# Patient Record
Sex: Female | Born: 1955 | Race: White | Hispanic: No
Health system: Midwestern US, Community
[De-identification: ages and names within clinical notes are randomized; demographics above are authoritative.]

## PROBLEM LIST (undated history)

## (undated) DIAGNOSIS — I499 Cardiac arrhythmia, unspecified: Secondary | ICD-10-CM

## (undated) DIAGNOSIS — R9439 Abnormal result of other cardiovascular function study: Secondary | ICD-10-CM

---

## 2019-05-11 ENCOUNTER — Inpatient Hospital Stay: Admit: 2019-05-11

## 2019-05-13 LAB — NOVEL CORONAVIRUS (COVID-19): SARS-CoV-2, NAA: NOT DETECTED

## 2019-05-13 LAB — COVID-19: SARS-CoV-2, NAA: NOT DETECTED

## 2019-07-12 ENCOUNTER — Inpatient Hospital Stay: Admit: 2019-07-12

## 2019-07-13 LAB — NOVEL CORONAVIRUS (COVID-19): SARS-CoV-2, NAA: NOT DETECTED

## 2019-07-13 LAB — COVID-19: SARS-CoV-2, NAA: NOT DETECTED

## 2019-08-17 ENCOUNTER — Inpatient Hospital Stay: Admit: 2019-08-17

## 2019-08-19 LAB — NOVEL CORONAVIRUS (COVID-19): SARS-CoV-2, NAA: NOT DETECTED

## 2019-08-19 LAB — COVID-19: SARS-CoV-2, NAA: NOT DETECTED

## 2019-08-28 ENCOUNTER — Ambulatory Visit: Attending: Specialist

## 2019-08-28 ENCOUNTER — Ambulatory Visit: Admit: 2019-08-28 | Discharge: 2019-08-28 | Payer: MEDICAID | Attending: Specialist

## 2019-08-28 DIAGNOSIS — Z136 Encounter for screening for cardiovascular disorders: Secondary | ICD-10-CM
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
## 2019-09-01 ENCOUNTER — Ambulatory Visit

## 2019-09-01 ENCOUNTER — Ambulatory Visit: Admit: 2019-09-01 | Discharge: 2019-09-01 | Payer: MEDICAID

## 2019-09-01 DIAGNOSIS — I499 Cardiac arrhythmia, unspecified: Secondary | ICD-10-CM

## 2019-09-04 LAB — ECHO ADULT COMPLETE
AV Area by Peak Velocity: 1.3 cm2
AV Area by VTI: 1.3 cm2
AV Cusp Mmode: 1.27 cm
AV Mean Gradient: 5 mmHg
AV Mean Velocity: 1.0535 m/s
AV Peak Gradient: 9.5 mmHg
AV Peak Velocity: 154.17 cm/s
AV VTI Ratio: 0.6
AV VTI: 32.57 cm
AV Velocity Ratio: 0.65
AVA/BSA Peak Velocity: 0.8 cm2/m2
AVA/BSA VTI: 0.8 cm2/m2
Ascending Aorta: 2.57 cm
EF BP: 63.3 % (ref 55.0–100.0)
Est. RA Pressure: 8 mmHg
IVSd M-mode: 0.6 cm
IVSs M-mode: 0.93 cm
LA Area 2C: 14.4 cm2
LA Area 4C: 15.4 cm2
LA Volume 2C: 34.81 mL (ref 22.00–52.00)
LA Volume 4C: 41.86 mL (ref 22.00–52.00)
LA Volume Index 2C: 22.63 ml/m2 (ref 16.00–28.00)
LA Volume Index 4C: 27.21 ml/m2 (ref 16.00–28.00)
LV EDV A2C: 65.9 mL
LV EDV A4C: 67.5 mL
LV EDV BP: 68.3 mL (ref 56.0–104.0)
LV EDV Index A2C: 42.8 mL/m2
LV EDV Index A4C: 43.9 mL/m2
LV EDV Index BP: 44.4 mL/m2
LV ESV A2C: 21.9 mL
LV ESV A4C: 23.8 mL
LV ESV BP: 25 mL (ref 19.0–49.0)
LV ESV Index A2C: 14.2 mL/m2
LV ESV Index A4C: 15.5 mL/m2
LV ESV Index BP: 16.2 mL/m2
LV Ejection Fraction A2C: 67 %
LV Ejection Fraction A4C: 65 %
LVIDd M-mode: 4.17 cm
LVIDs M-mode: 2.6 cm
LVOT Diameter: 1.61 cm
LVOT Peak Gradient: 4 mmHg
LVOT Peak Velocity: 99.9 cm/s
LVOT VTI: 21.02 cm
LVPWd M-mode: 0.9 cm
LVPWs M-mode: 0.9 cm
Left Ventricular Fractional Shortening: 37.6 %
Left Ventricular Outflow Tract Mean Gradient: 2.1881 mmHg
Left Ventricular Outflow Tract Mean Velocity: 0.6699 cm/s
Left Ventricular Stroke Volume by 2-D Biplane-MOD: 28.0189 mL
Left Ventricular Stroke Volume by 2-D Single Plane- MOD: 28.2801 mL
Left Ventricular Stroke Volume by 2-D Single Plane- MOD: 28.4924 mL
MV A Velocity: 82.06 cm/s
MV E Velocity: 77.47 cm/s
MV E Wave Deceleration Time: 193.6 ms
MV E/A: 0.94
Mitral Valve Deceleration Slope: 4.0015
PASP: 42.9 mmHg
PV AT: 166.1 ms
PV Max Velocity: 91.14 cm/s
PV Peak Gradient: 3.3 mmHg
RA Volume: 18.3 ml
RVSP: 42.9 mmHg
TR Max Velocity: 295.54 cm/s
TR Peak Gradient: 34.9 mmHg

## 2019-09-04 LAB — TRANSTHORACIC ECHOCARDIOGRAM (TTE) COMPLETE (CONTRAST/BUBBLE/3D PRN)
AV Area by Peak Velocity: 1.3 cm2
AV Area by VTI: 1.3 cm2
AV Cusp Mmode: 1.27 cm
AV Mean Gradient: 5 mmHg
AV Mean Velocity: 1.0535 m/s
AV Peak Gradient: 9.5 mmHg
AV Peak Velocity: 154.17 cm/s
AV VTI: 32.57 cm
AV Velocity Ratio: 0.65
AVA/BSA Peak Velocity: 0.8 cm2/m2
AVA/BSA VTI: 0.8 cm2/m2
Ascending Aorta: 2.57 cm
EF BP: 63.3 % (ref 55–100)
Est. RA Pressure: 8 mmHg
IVSd M-mode: 0.6 cm
IVSs M-mode: 0.93 cm
LA Area 2C: 14.4 cm2
LA Area 4C: 15.4 cm2
LA Volume 2C: 34.81 mL (ref 22–52)
LA Volume 4C: 41.86 mL (ref 22–52)
LA Volume Index 2C: 22.63 ml/m2 (ref 16–28)
LA Volume Index 4C: 27.21 ml/m2 (ref 16–28)
LV EDV A2C: 65.9 mL
LV EDV A4C: 67.5 mL
LV EDV BP: 68.3 mL (ref 56–104)
LV EDV Index A2C: 42.8 mL/m2
LV EDV Index A4C: 43.9 mL/m2
LV EDV Index BP: 44.4 mL/m2
LV ESV A2C: 21.9 mL
LV ESV A2C: 28.4924 mL
LV ESV A4C: 23.8 mL
LV ESV A4C: 28.2801 mL
LV ESV BP: 25 mL (ref 19–49)
LV ESV Index A2C: 14.2 mL/m2
LV ESV Index A4C: 15.5 mL/m2
LV ESV Index BP: 16.2 mL/m2
LV Ejection Fraction A2C: 67 %
LV Ejection Fraction A4C: 65 %
LVIDd M-mode: 4.17 cm
LVIDs M-mode: 2.6 cm
LVOT Diameter: 1.61 cm
LVOT Mean Gradient: 2.1881 mmHg
LVOT Peak Gradient: 4 mmHg
LVOT Peak Velocity: 99.9 cm/s
LVOT VTI: 21.02 cm
LVOT:AV VTI Index: 0.6
LVPWd M-mode: 0.9 cm
LVPWs M-mode: 0.9 cm
Left Ventricular Ejection Fraction: 63
Left Ventricular Fractional Shortening: 37.6 %
Left Ventricular Outflow Tract Mean Velocity: 0.6699 cm/s
Left Ventricular Stroke Volume by 2-D Biplane-MOD: 28.0189 mL
MV A Velocity: 82.06 cm/s
MV E Velocity: 77.47 cm/s
MV E Wave Deceleration Time: 193.6 ms
MV E/A: 0.94
Mitral Valve E-F Slope by M-mode: 4.0015
PASP: 42.9 mmHg
PV AT: 166.1 ms
PV Max Velocity: 91.14 cm/s
PV Peak Gradient: 3.3 mmHg
RA Volume: 18.3 ml
RVSP: 42.9 mmHg
TR Max Velocity: 295.54 cm/s
TR Peak Gradient: 34.9 mmHg

## 2019-09-12 ENCOUNTER — Inpatient Hospital Stay: Admit: 2019-09-12

## 2019-09-13 LAB — NOVEL CORONAVIRUS (COVID-19): SARS-CoV-2, NAA: NOT DETECTED

## 2019-09-13 LAB — COVID-19: SARS-CoV-2, NAA: NOT DETECTED

## 2019-10-02 ENCOUNTER — Encounter

## 2019-10-02 ENCOUNTER — Inpatient Hospital Stay: Admit: 2019-10-02

## 2019-10-02 ENCOUNTER — Institutional Professional Consult (permissible substitution): Admit: 2019-10-02 | Discharge: 2019-10-02 | Payer: MEDICAID | Attending: Specialist

## 2019-10-02 DIAGNOSIS — R9439 Abnormal result of other cardiovascular function study: Secondary | ICD-10-CM

## 2019-10-02 LAB — EXERCISE CARDIAC STRESS TEST
Angina Index: 0
Baseline Diastolic BP: 60 mmHg
Baseline HR: 82 {beats}/min
Baseline Systolic BP: 98 mmHg
Duke Treadmill Score: -4
Exercise Duration Time: 6:0 {titer}
Stress Diastolic BP: 60 mmHg
Stress Estimated Workload: 7 METS
Stress Peak HR: 157 {beats}/min
Stress Percent HR Achieved: 101 %
Stress Rate Pressure Product: 22922 bpm*mmHg
Stress ST Depression: 2 mm
Stress Systolic BP: 146 mmHg
Stress Target HR: 156 {beats}/min

## 2019-10-02 LAB — STRESS TEST ONLY EXERCISE
Angina Index: 0
Baseline Diastolic BP: 60 mmHg
Baseline HR: 82 {beats}/min
Baseline Systolic BP: 98 mmHg
Duke Treadmill Score: -4
Exercise Duration Time: 6:0 {titer}
Stress Diastolic BP: 60 mmHg
Stress Estimated Workload: 7 METS
Stress Peak HR: 157 {beats}/min
Stress Percent HR Achieved: 101 %
Stress Rate Pressure Product: 22922 bpm*mmHg
Stress ST Depression: 2 mm
Stress Systolic BP: 146 mmHg
Stress Target HR: 156 {beats}/min

## 2019-10-02 NOTE — Progress Notes (Signed)
Dear Dr. Odis Hollingshead, Deno Lunger, MD     Ms. Grill underwent treadmill stress test today.    Stress Test Report  Exercise time: 6 minutes 0 seconds.    Heart Rate: 100 % of maximum predicted Heart Rate    Blood Pressure Response: Normal    Symptoms: Fatigue    EKG Changes: abnormal; 2 mm horizontal ST depression infero-lateral    Recommendations:  1. Continue Cardiac medication  2. Diet and exercise counseling  3. Recommend nuclear stress test   Stress test results were discussed with the patient.      Echo report D/w patient  ?? LV: Estimated LVEF is 60 - 65%. Normal cavity size, wall thickness, systolic function (ejection fraction normal) and diastolic function.  ?? PA: Pulmonary arterial systolic pressure is 37 mmHg.  ?? Trace TR noted.    Rec- Nuclear stress; Patient agrees    Thank you for allowing me to participate in the care of Brooke Lawson.    Sincerely,  Dr. Lyndel Pleasure

## 2019-10-02 NOTE — Progress Notes (Signed)
Dear Dr. Profeta, George J, MD     [name] underwent treadmill stress test today.    Stress Test Report  Exercise time: 6 minutes 0 seconds.    Heart Rate: 100 % of maximum predicted Heart Rate    Blood Pressure Response: Normal    Symptoms: Fatigue    EKG Changes: abnormal; 2 mm horizontal ST depression infero-lateral    Recommendations:  1. Continue Cardiac medication  2. Diet and exercise counseling  3. Recommend nuclear stress test   Stress test results were discussed with the patient.      Echo report D/w patient  ?? LV: Estimated LVEF is 60 - 65%. Normal cavity size, wall thickness, systolic function (ejection fraction normal) and diastolic function.  ?? PA: Pulmonary arterial systolic pressure is 37 mmHg.  ?? Trace TR noted.    Rec- Nuclear stress; Patient agrees    Thank you for allowing me to participate in the care of Brooke Lawson.    Sincerely,  Dr. Brookelynne Dimperio

## 2019-10-03 LAB — NOVEL CORONAVIRUS (COVID-19): SARS-CoV-2, NAA: NOT DETECTED

## 2019-10-03 LAB — COVID-19: SARS-CoV-2, NAA: NOT DETECTED

## 2019-10-12 ENCOUNTER — Encounter

## 2019-11-28 ENCOUNTER — Ambulatory Visit

## 2019-11-28 ENCOUNTER — Ambulatory Visit: Admit: 2019-11-28 | Discharge: 2019-11-28 | Payer: MEDICAID

## 2019-11-28 DIAGNOSIS — R9439 Abnormal result of other cardiovascular function study: Secondary | ICD-10-CM

## 2019-11-28 MED ORDER — TECHNETIUM TC-99M SESTAMIBI (CARDIOLITE) INJECTION WITH DILUTION KIT
Freq: Once | INTRAVENOUS | Status: AC
Start: 2019-11-28 — End: 2019-11-28
  Administered 2019-11-28: 14:00:00 via INTRAVENOUS

## 2019-11-28 MED ORDER — TECHNETIUM TC-99M SESTAMIBI (CARDIOLITE) INJECTION WITH DILUTION KIT
Freq: Once | INTRAVENOUS | Status: AC
Start: 2019-11-28 — End: 2019-11-28
  Administered 2019-11-28: 17:00:00 via INTRAVENOUS

## 2019-12-07 LAB — NM STRESS TEST WITH MYOCARDIAL PERFUSION
Angina Index: 0
Baseline Diastolic BP: 60 mmHg
Baseline HR: 58 {beats}/min
Baseline Systolic BP: 100 mmHg
Left Ventricular Ejection Fraction: 74
Stress Diastolic BP: 60 mmHg
Stress Estimated Workload: 9.3 METS
Stress Peak HR: 162 {beats}/min
Stress Percent HR Achieved: 104 %
Stress Rate Pressure Product: 22032 bpm*mmHg
Stress Systolic BP: 136 mmHg
Stress Target HR: 156 {beats}/min

## 2019-12-08 LAB — NUCLEAR CARDIAC STRESS TEST
Angina Index: 0
Baseline Diastolic BP: 60 mmHg
Baseline HR: 58 {beats}/min
Baseline Systolic BP: 100 mmHg
Stress Diastolic BP: 60 mmHg
Stress Estimated Workload: 9.3 METS
Stress Peak HR: 162 {beats}/min
Stress Percent HR Achieved: 104 %
Stress Rate Pressure Product: 22032 bpm*mmHg
Stress Systolic BP: 136 mmHg
Stress Target HR: 156 {beats}/min

## 2019-12-11 ENCOUNTER — Ambulatory Visit: Attending: Specialist

## 2019-12-11 ENCOUNTER — Ambulatory Visit: Admit: 2019-12-11 | Discharge: 2019-12-11 | Payer: MEDICAID | Attending: Specialist

## 2019-12-11 DIAGNOSIS — I499 Cardiac arrhythmia, unspecified: Secondary | ICD-10-CM
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
## 2019-12-11 NOTE — Addendum Note (Signed)
Addendum Note by Lyndel Pleasure, MD at 12/11/19 1130                Author: Lyndel Pleasure, MD  Service: --  Author Type: Physician       Filed: 12/11/19 1235  Encounter Date: 12/11/2019  Status: Signed          Editor: Lyndel Pleasure, MD (Physician)          Addended by: Lyndel Pleasure on: 12/11/2019 12:35 PM    Modules accepted: Orders

## 2019-12-20 ENCOUNTER — Ambulatory Visit

## 2019-12-20 ENCOUNTER — Ambulatory Visit: Admit: 2019-12-20 | Discharge: 2019-12-20 | Payer: MEDICAID

## 2019-12-20 DIAGNOSIS — I499 Cardiac arrhythmia, unspecified: Secondary | ICD-10-CM

## 2019-12-20 LAB — DUPLEX CAROTID BILATERAL
LEFT COMMON CAROTID ARTERY MID D: 21.1 cm/s
LEFT COMMON CAROTID ARTERY MID S: 95.6 cm/s
LEFT EXTERNAL CAROTID ARTERY D: 8.4 cm/s
LEFT VERTEBRAL ARTERY D: 10.9 cm/s
Left CCA dist dias: 14.1 cm/s
Left CCA dist sys: 63.3 cm/s
Left CCA prox dias: 23.9 cm/s
Left CCA prox sys: 122.3 cm/s
Left ECA sys: 119.5 cm/s
Left ICA dist dias: 23.9 cm/s
Left ICA dist sys: 61.9 cm/s
Left ICA mid dias: 18.3 cm/s
Left ICA mid sys: 56.2 cm/s
Left ICA prox dias: 18.3 cm/s
Left ICA prox sys: 70.3 cm/s
Left vertebral sys: 39.4 cm/s
RIGHT COMMON CAROTID ARTERY MID D: 16.9 cm/s
RIGHT COMMON CAROTID ARTERY MID S: 71.7 cm/s
RIGHT EXTERNAL CAROTID ARTERY D: 8.4 cm/s
RIGHT VERTEBRAL ARTERY D: 8.4 cm/s
Right CCA dist dias: 18.3 cm/s
Right CCA prox dias: 19.7 cm/s
Right CCA prox sys: 115.3 cm/s
Right ICA dist dias: 25.3 cm/s
Right ICA dist sys: 68.9 cm/s
Right ICA mid dias: 28.1 cm/s
Right ICA mid sys: 73.1 cm/s
Right ICA prox dias: 26.7 cm/s
Right ICA prox sys: 94.2 cm/s
Right cca dist sys: 70.3 cm/s
Right eca sys: 104 cm/s
Right vertebral sys: 42.2 cm/s

## 2019-12-20 LAB — VAS DUP CAROTID BILATERAL
Left CCA dist EDV: 14.1 cm/s
Left CCA dist PSV: 63.3 cm/s
Left CCA mid EDV: 21.1 cm/s
Left CCA mid PSV: 95.6 cm/s
Left CCA prox EDV: 23.9 cm/s
Left CCA prox PSV: 122.3 cm/s
Left ECA EDV: 8.4 cm/s
Left ECA PSV: 119.5 cm/s
Left ICA dist EDV: 23.9 cm/s
Left ICA dist PSV: 61.9 cm/s
Left ICA mid EDV: 18.3 cm/s
Left ICA mid PSV: 56.2 cm/s
Left ICA prox EDV: 18.3 cm/s
Left ICA prox PSV: 70.3 cm/s
Left vertebral EDV: 10.9 cm/s
Left vertebral PSV: 39.4 cm/s
Right CCA dist EDV: 18.3 cm/s
Right CCA mid EDV: 16.9 cm/s
Right CCA mid PSV: 71.7 cm/s
Right CCA prox EDV: 19.7 cm/s
Right CCA prox PSV: 115.3 cm/s
Right ECA EDV: 8.4 cm/s
Right ECA PSV: 104 cm/s
Right ICA dist EDV: 25.3 cm/s
Right ICA dist PSV: 68.9 cm/s
Right ICA mid EDV: 28.1 cm/s
Right ICA mid PSV: 73.1 cm/s
Right ICA prox EDV: 26.7 cm/s
Right ICA prox PSV: 94.2 cm/s
Right cca dist PSV: 70.3 cm/s
Right vertebral EDV: 8.4 cm/s
Right vertebral PSV: 42.2 cm/s

## 2020-03-04 ENCOUNTER — Encounter: Payer: MEDICAID | Attending: Specialist

## 2020-03-07 ENCOUNTER — Ambulatory Visit: Attending: Specialist

## 2020-03-07 ENCOUNTER — Ambulatory Visit: Admit: 2020-03-07 | Discharge: 2020-03-07 | Payer: MEDICAID | Attending: Specialist

## 2020-03-07 DIAGNOSIS — I499 Cardiac arrhythmia, unspecified: Secondary | ICD-10-CM
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
## 2020-03-14 ENCOUNTER — Encounter: Payer: MEDICAID | Attending: Specialist

## 2020-11-22 IMAGING — MG MAMMOGRAPHY DIAGNOSTIC BILATERAL 3[PERSON_NAME]
8 series · 8 of 24 positions shown · non-contrast
Comparison: Ultrasound of 02/07/2020. Exams dating back to 06/13/2015.

FINAL Diagnostic Imaging Report 
________________________________________________________________________________________________ 
MAMMOGRAPHY DIAGNOSTIC BILATERAL 3MELIDA LIANG, BREAST ULTRASOUND BILATERAL COMPLETE, 
11/22/2020 [DATE]: 
CLINICAL INDICATION: Patient states right breast pain for over one year. Pain 
getting more intense and feels a pulling sensation at nipple area since June 2020. Follow-up outside left breast ultrasound exam.
TECHNIQUE: Mammogram: Digital bilateral mammograms and 3-D Tomosynthesis were 
obtained. These were interpreted both primarily and with the aid of 
computer-aided detection system. Ultrasound: Ultrasound was performed of all 4 
quadrants of both breasts, retroareolar region and axilla. 
BREAST DENSITY: (Level C) The breasts are heterogeneously dense, which may 
obscure small masses.

[L MLO]
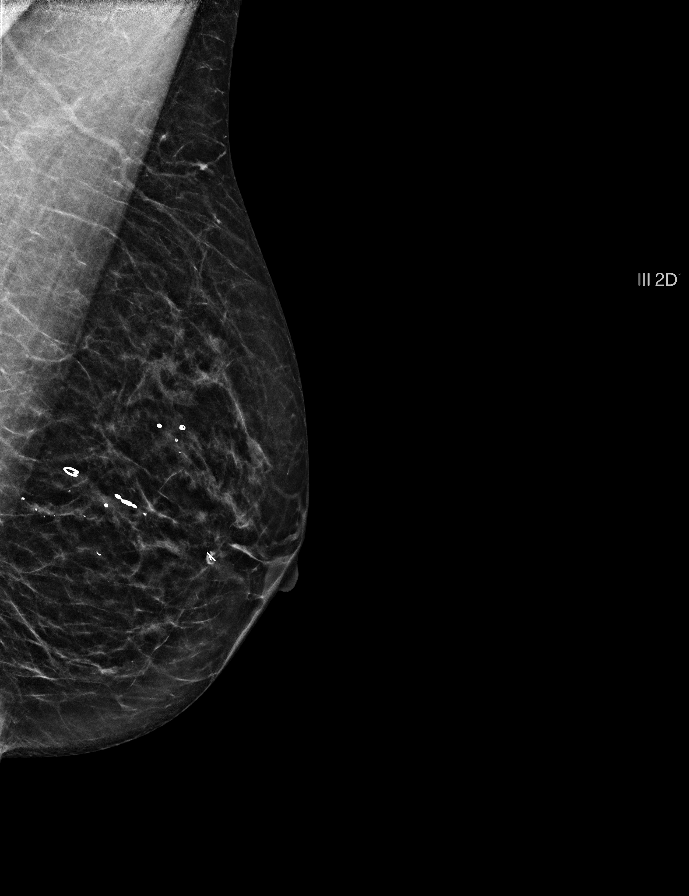

[R MLO]
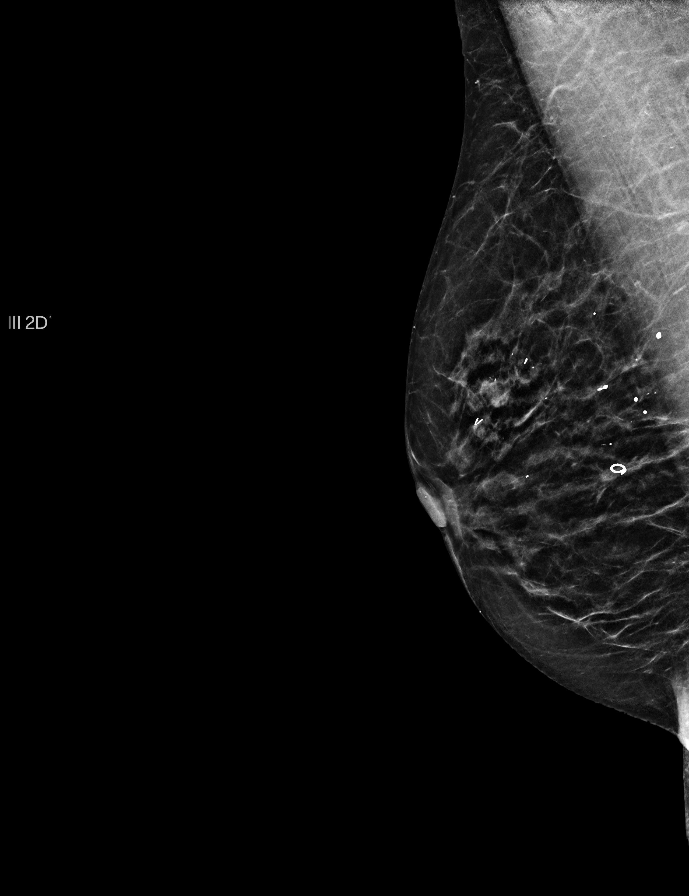

[L CC]
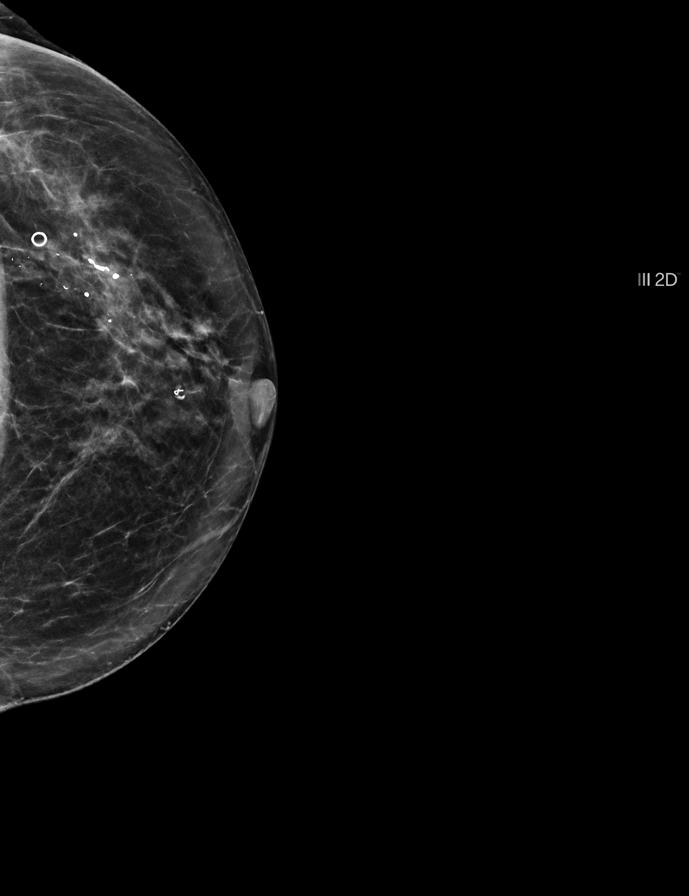

[R CC]
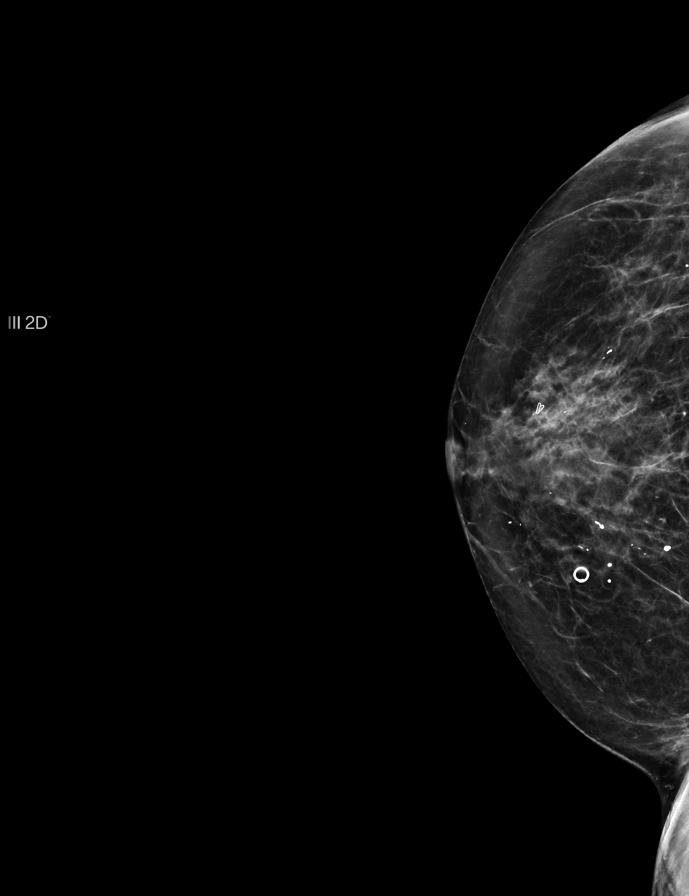

[L MLO tomo · tomo slice 21/41.0]
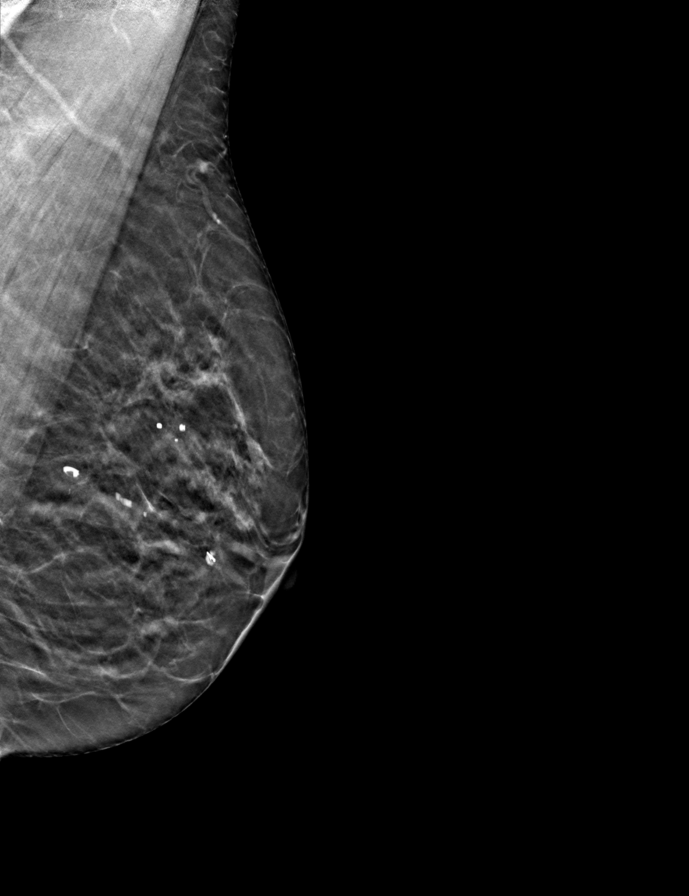

[R CC tomo · tomo slice 21/42.0]
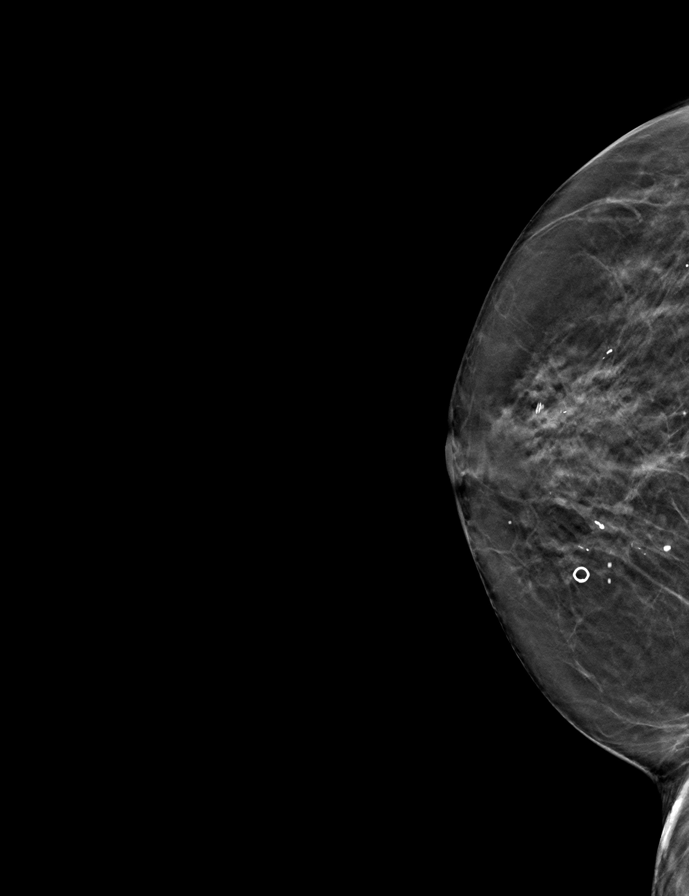

[L CC tomo · tomo slice 22/43.0]
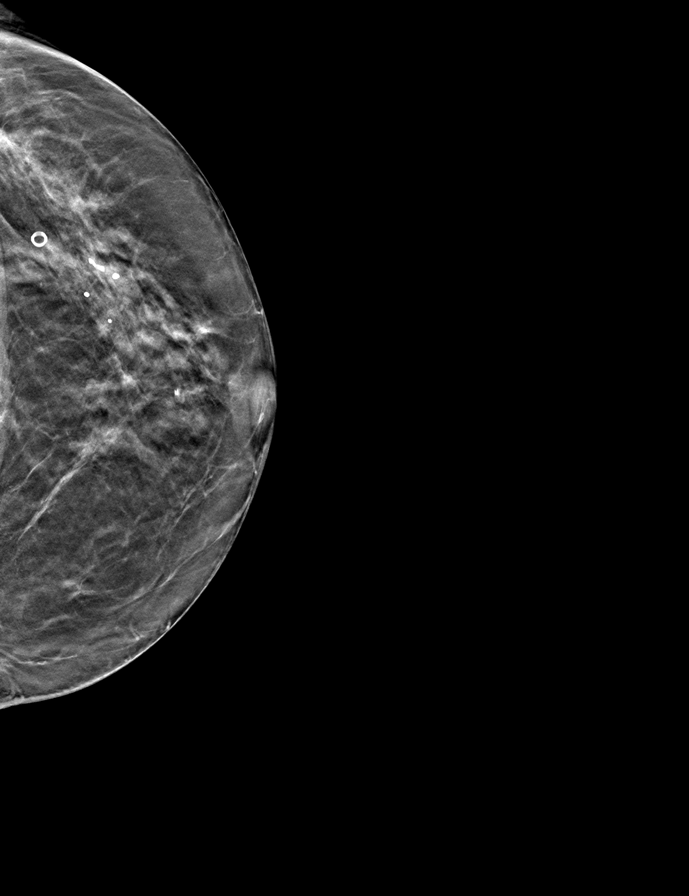

[R MLO tomo · tomo slice 21/40.0]
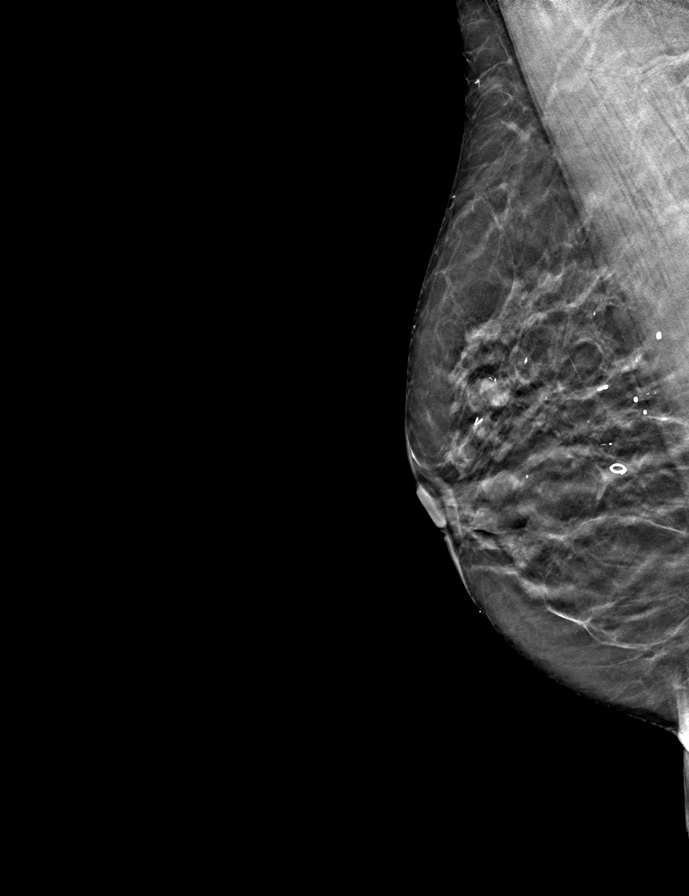

[8 of 24 positions shown; findings below may reference images not displayed]

FINDINGS: Mammogram: Post biopsy tissue markers both breasts. No suspicious mass, 
calcifications, or architectural distortion in either breast. Overall stable 
mammographic appearance. 
Ultrasound: Within the right breast at the [DATE] position, 1 cm from the level 
the nipple is a hypoechoic area with echogenic central hilum compatible with an 
intramammary node. This measures 6 x 3 x 5 mm and previously measured 5 x 4 x 3 
mm. There are adjacent subcentimeter intramammary nodes. Within the right 
retroareolar region there is a cyst containing some debris measuring 13 x 7 x 12 
mm. 
Within the left breast at the [DATE] position, 2 cm from the level the nipple is a 
hypoechoic area with central echogenic hilum compatible with an intramammary 
node. This measures 6 x 3 x 7 mm. Again seen is the clip in the [DATE] position, 5 
cm from the level of the nipple. At the [DATE] position, 5 cm from the level the 
nipple, adjacent to the clip, is a hypoechoic circumscribed area measuring 6 x 2 
x 5 mm, previously measuring 7 x 2 x 4 mm.
IMPRESSION: No ultrasonographic or mammographic findings suggestive for malignancy. 
( BI-RADS 2) Benign findings. Routine mammographic follow-up is recommended.

## 2020-12-19 IMAGING — MR MRI BREAST BILATERAL W/WO CONTRAST
5 of 12 series · 19 of 48 positions shown · IV contrast (gadolinium)
Comparison: Mammogram and ultrasound of 11/22/2020. Exams dating back to 
06/13/2015.

FINAL Diagnostic Imaging Report 
________________________________________________________________________________________________ 
MRI BREAST BILATERAL W/WO CONTRAST, 12/19/2020 [DATE]: 
CLINICAL INDICATION: Abnormal mammogram. Persistent right breast pain. History 
of benign biopsy years ago.
TECHNIQUE: Multiple sequences were obtained in various planes with both before 
and after the intravenous administration of gadolinium. In addition to the 
routine images, three-dimensional renderings were performed on an independent 
workstation, time activity curves generated over areas of enhancement and 
computer-aided detection utilized. 5.5 mL of Gadavist were injected 
intravenously with the injector at a rate of 1.5 cc per second. Patient was 
scanned on a 3T magnet.

[Series 201: survey · axial · 7.0mm · 1.34mm/px · z∈[-15,+244]mm · 2 of 25 slices shown]
[im 1/25]
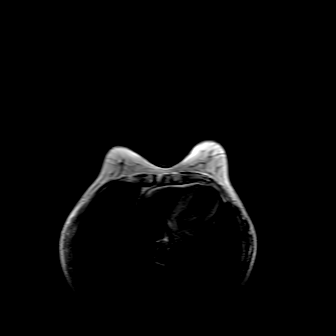
[im 25/25]
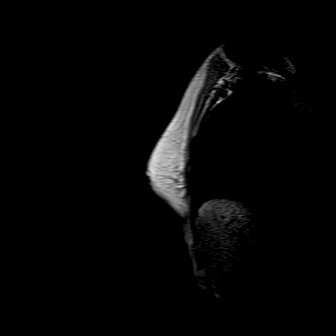

[Series 301: t1w_ffe_3d · axial · 1.5mm · 0.49mm/px · z∈[-52,+128]mm · 5 of 205 slices shown]
[im 1/205]
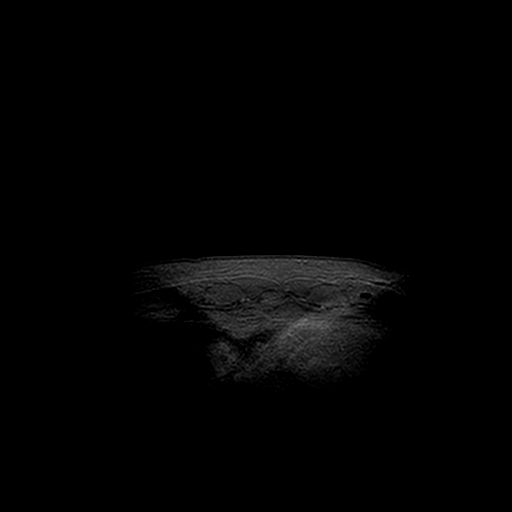
[im 52/205]
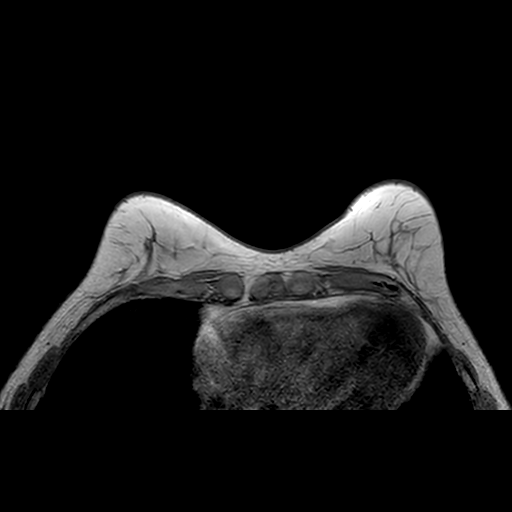
[im 103/205]
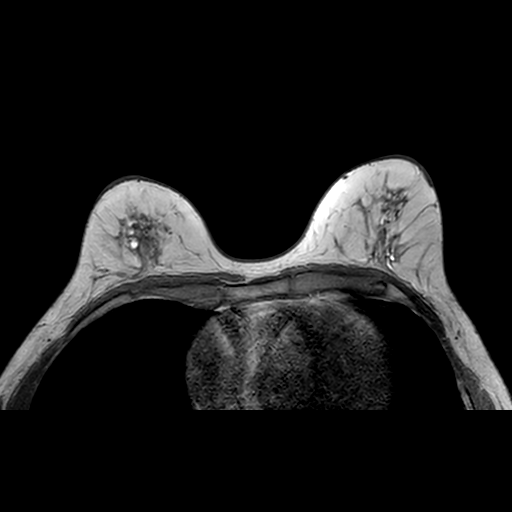
[im 154/205]
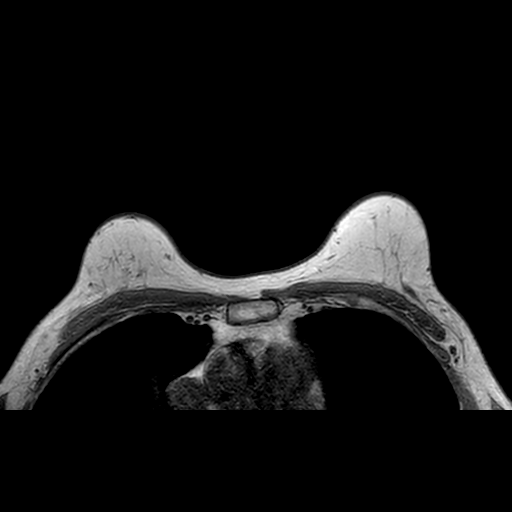
[im 205/205]
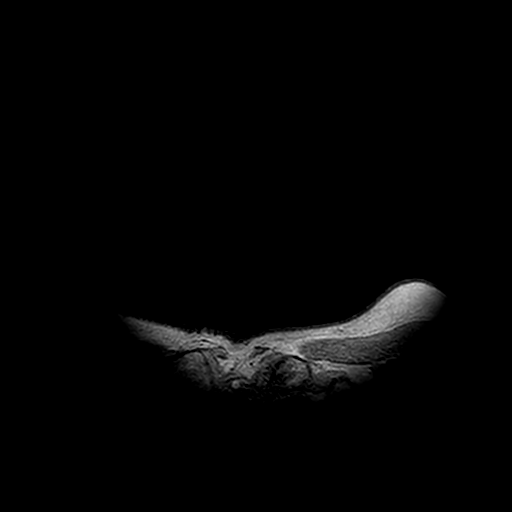

[Series 401: t2w_(id) · axial · 2.5mm · 0.58mm/px · z∈[-55,+122]mm · 2 of 71 slices shown]
[im 1/71]
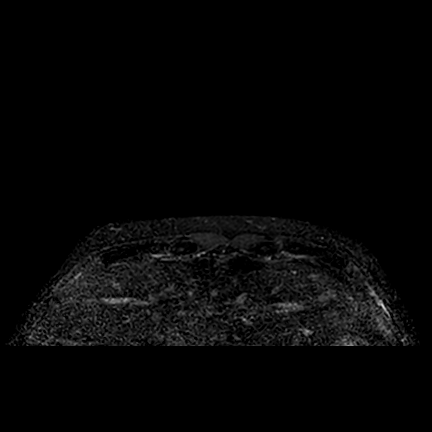
[im 71/71]
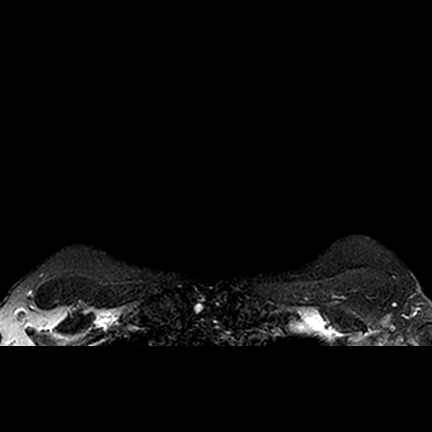

[Series 502: ssub dyn 1 · axial · 1.5mm · 0.45mm/px · z∈[-55,+124]mm · 5 of 240 slices shown]
[im 1/240]
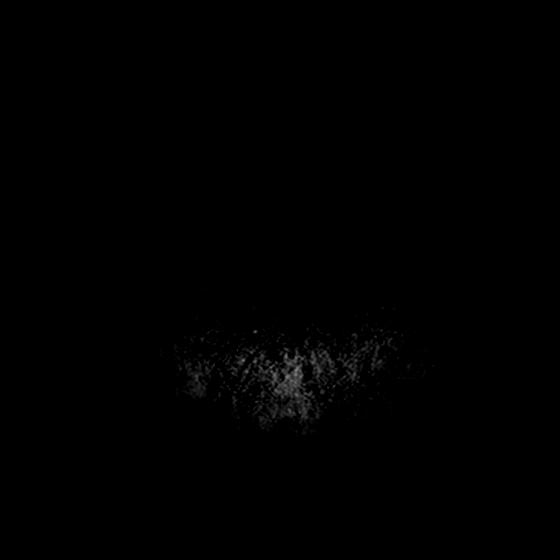
[im 60/240]
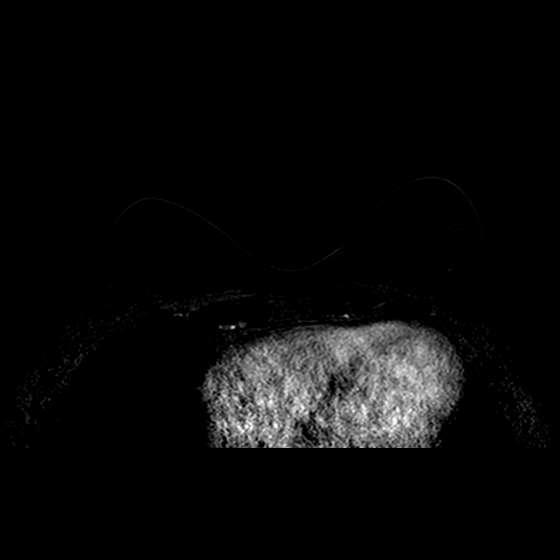
[im 120/240]
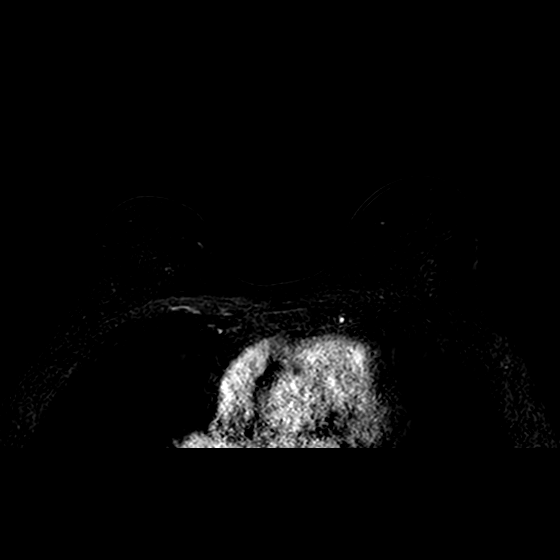
[im 180/240]
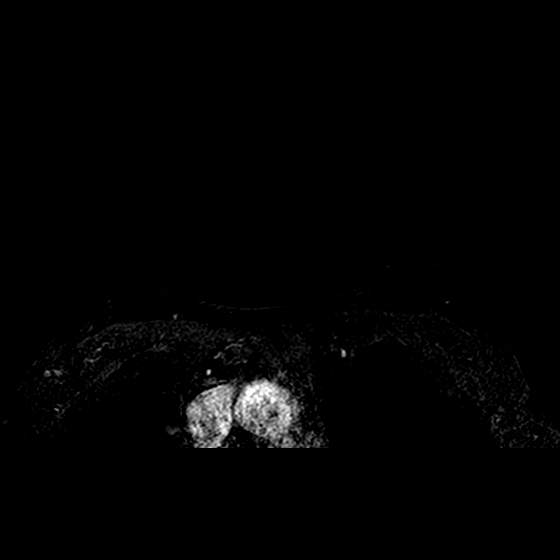
[im 240/240]
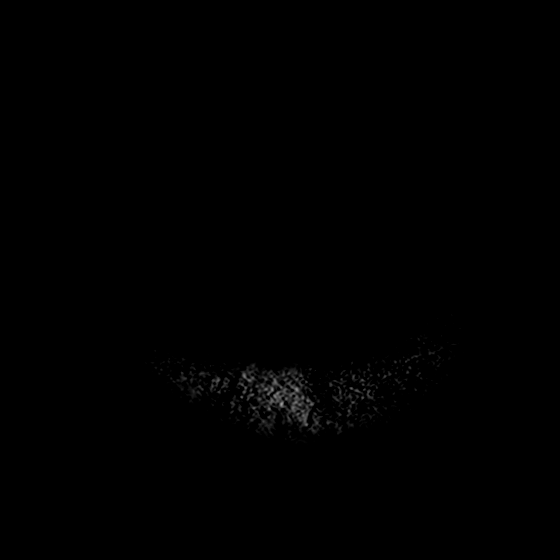

[Series 503: ssub dyn 2 · axial · 1.5mm · 0.45mm/px · z∈[-55,+124]mm · 5 of 240 slices shown]
[im 1/240]
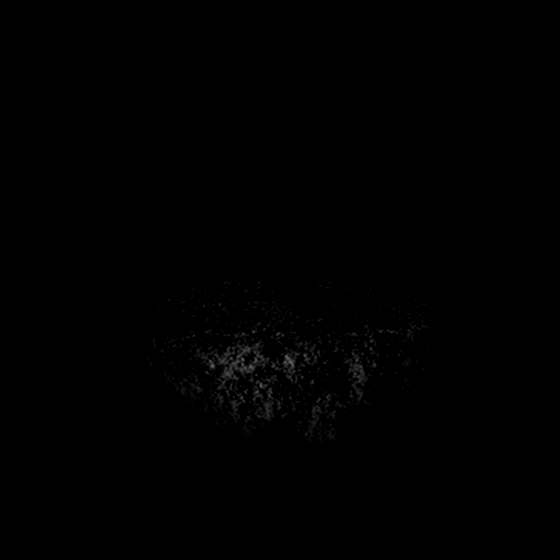
[im 60/240]
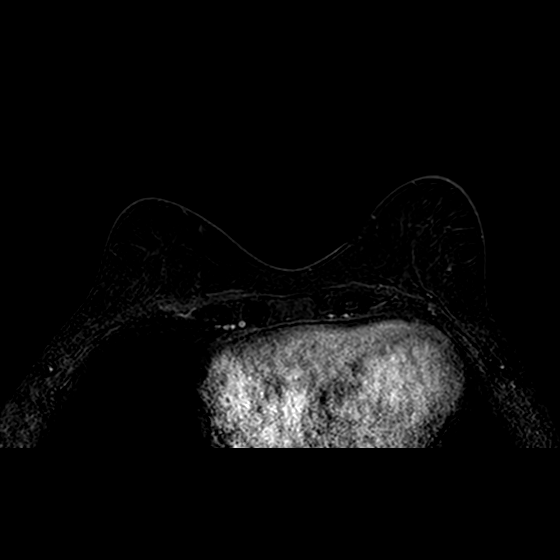
[im 120/240]
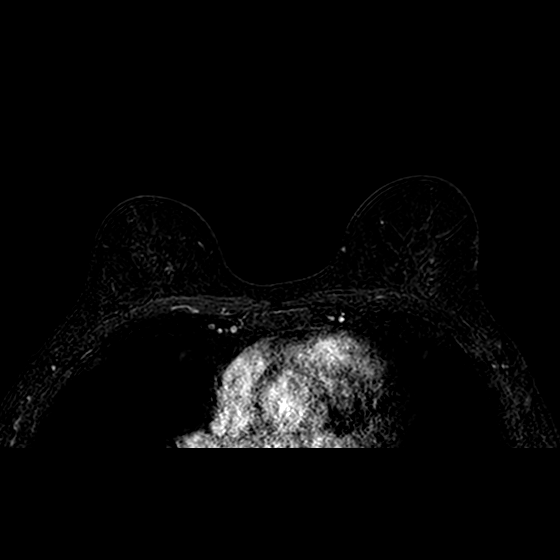
[im 180/240]
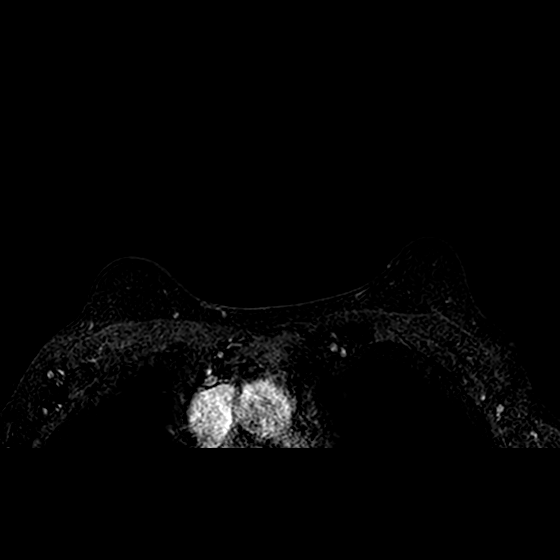
[im 240/240]
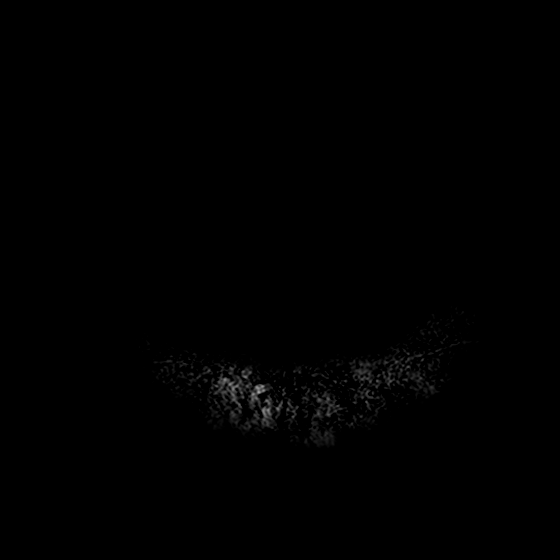

[19 of 48 positions shown; findings below may reference images not displayed]

FINDINGS: Heterogeneous breast parenchyma. Mild background parenchymal 
enhancement. 
Right breast: There is metallic susceptibility artifact about the postbiopsy 
tissue marker. No abnormal areas of enhancement. No areas of enhancement meeting 
threshold criteria on CAD analysis. Small nonspecific right axillary lymph 
nodes.  No internal mammary nodes. 
Left breast: There is metallic susceptibility artifact about the postbiopsy 
tissue marker. No abnormal areas of enhancement. No areas of enhancement meeting 
threshold criteria on CAD analysis. Small nonspecific left axillary lymph nodes. 
 No internal mammary nodes.
IMPRESSION: No MR findings suggestive for malignancy. 
(BI-RADS 2) Benign findings. Routine mammographic follow-up is recommended.

## 2021-01-20 IMAGING — CT CT ABDOMEN AND PELVIS WITH CONTRAST
2 of 3 series · 16 of 46 positions shown, 18 images · IV contrast (isovue)
Comparison: 

________________________________________________________________________________________________ 
CT ABDOMEN AND PELVIS WITH CONTRAST, 01/20/2021 [DATE]: 
A search for DICOM formatted images was conducted for prior CT imaging studies 
completed at a non-affiliated media free facility.   
CLINICAL INDICATION: Stabbing pain in the abdomen radiating to right lower 
quadrant.
TECHNIQUE: The abdomen and pelvis was scanned from lung bases through the pubic 
rami with 100 mL of Isovue 300 on a high-resolution CT scanner using dose 
reduction techniques.  Routine MPR reconstructions were performed.

[Series 4: axial · axial · 0.60mm/px · z∈[+939,+1299]mm · 13 of 138 slices shown, 15 images]
[im 9/138  soft-tissue]
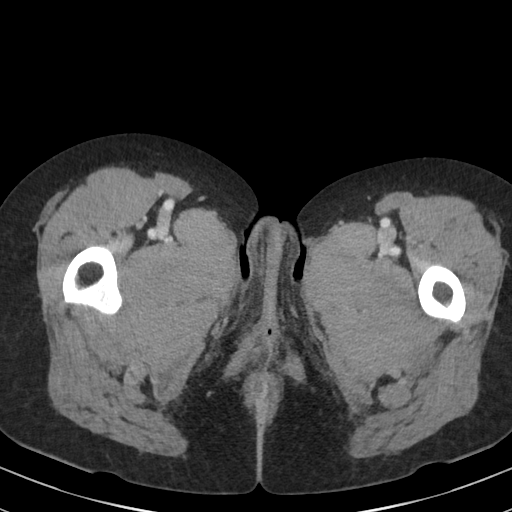
[im 9/138  bone]
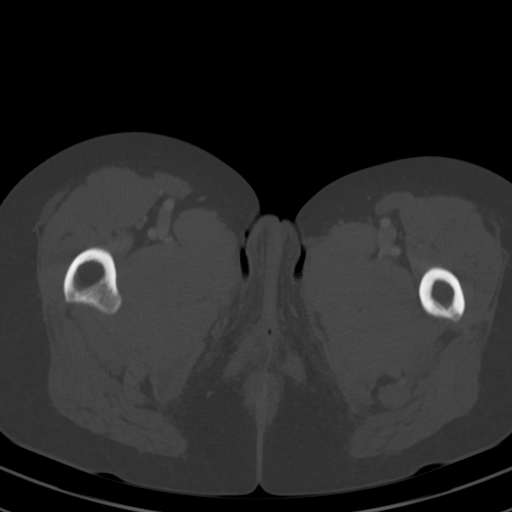
[im 18/138  soft-tissue]
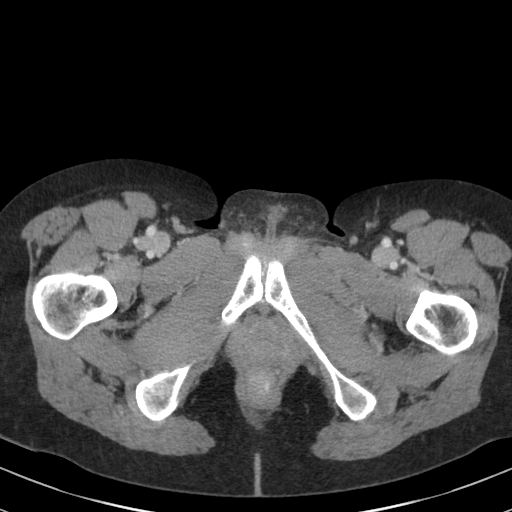
[im 27/138  soft-tissue]
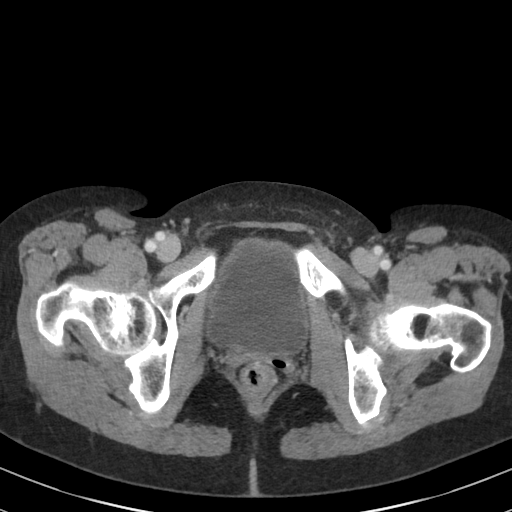
[im 40/138  soft-tissue]
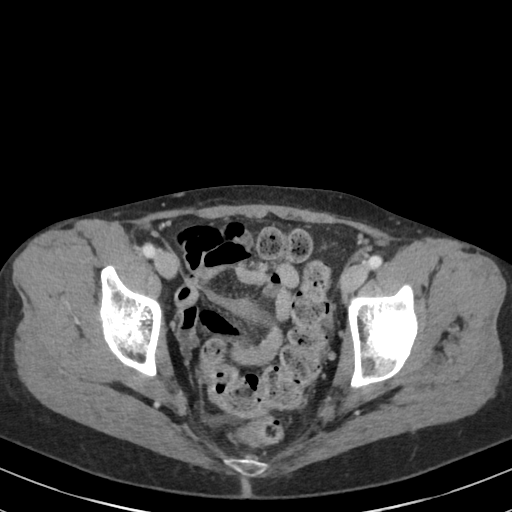
[im 49/138  soft-tissue]
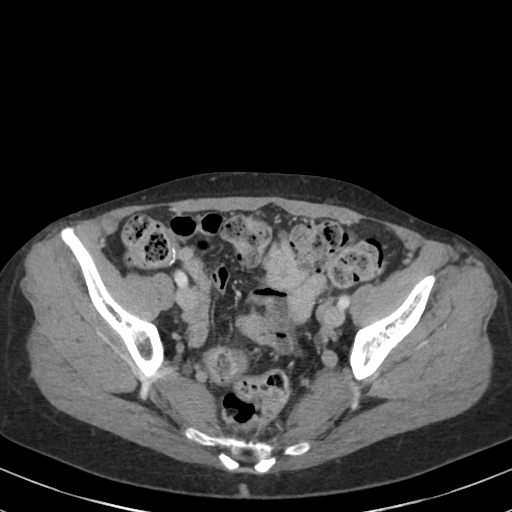
[im 58/138  soft-tissue]
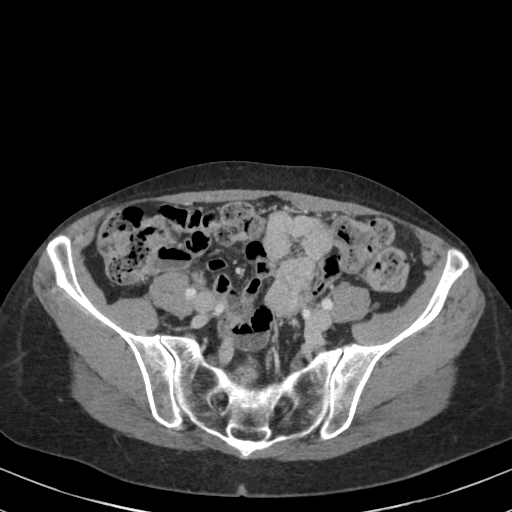
[im 71/138  soft-tissue]
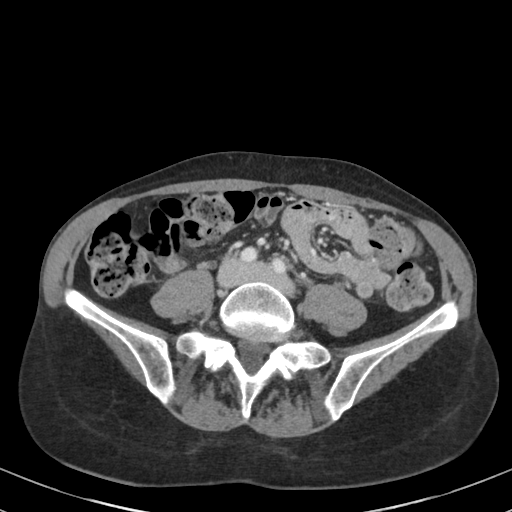
[im 80/138  soft-tissue]
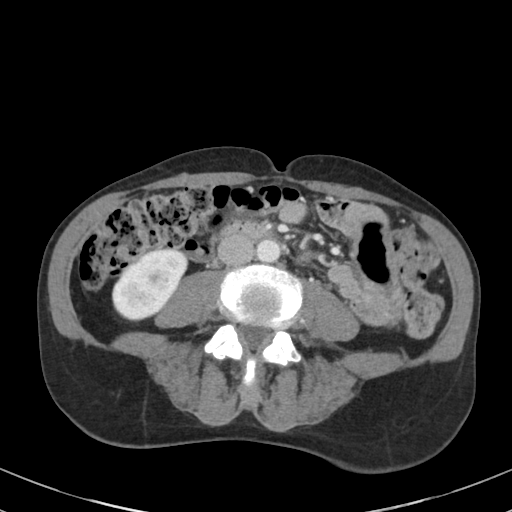
[im 89/138  soft-tissue]
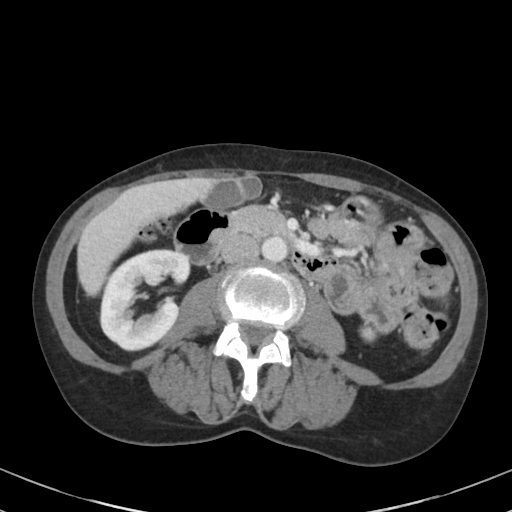
[im 89/138  bone]
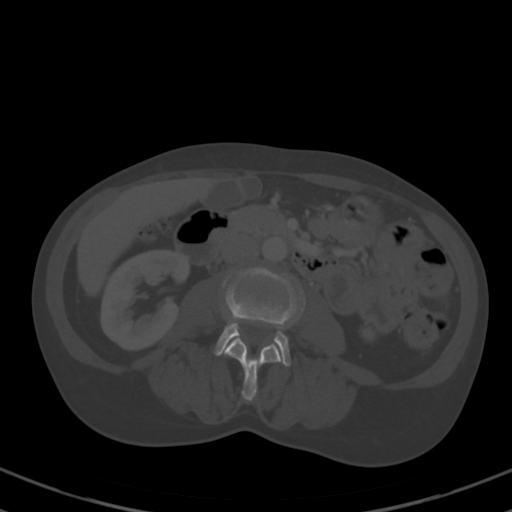
[im 98/138  soft-tissue]
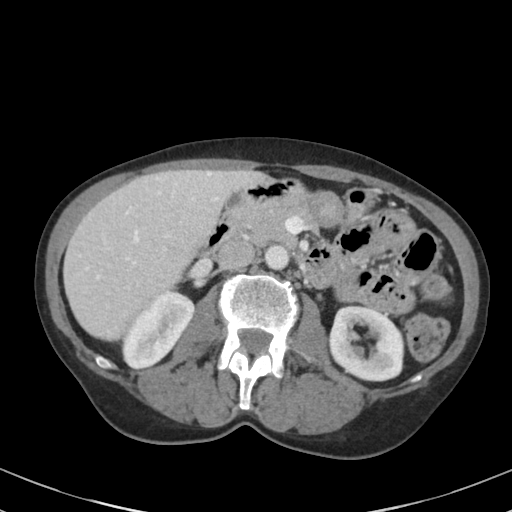
[im 111/138  soft-tissue]
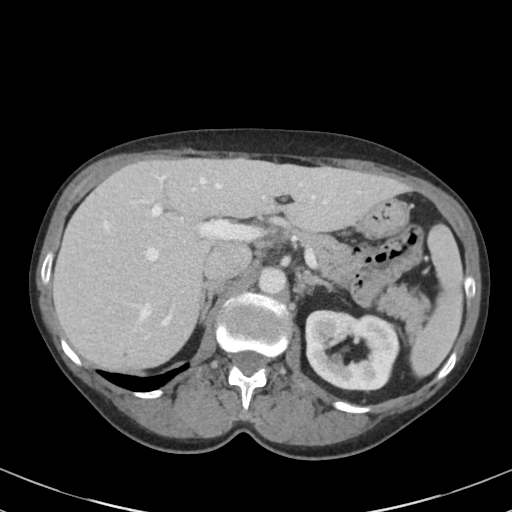
[im 120/138  soft-tissue]
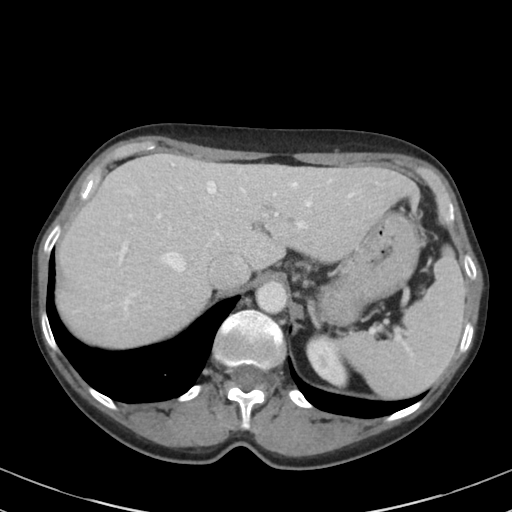
[im 129/138  soft-tissue]
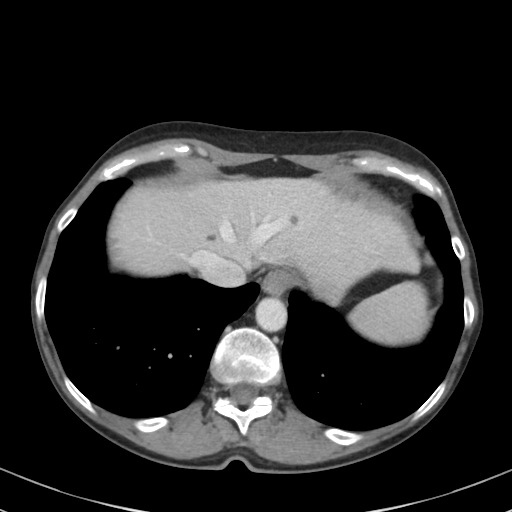

[Series 5: cor · coronal · 0.66mm/px · 3 of 107 slices shown]
[im 36/107  soft-tissue]
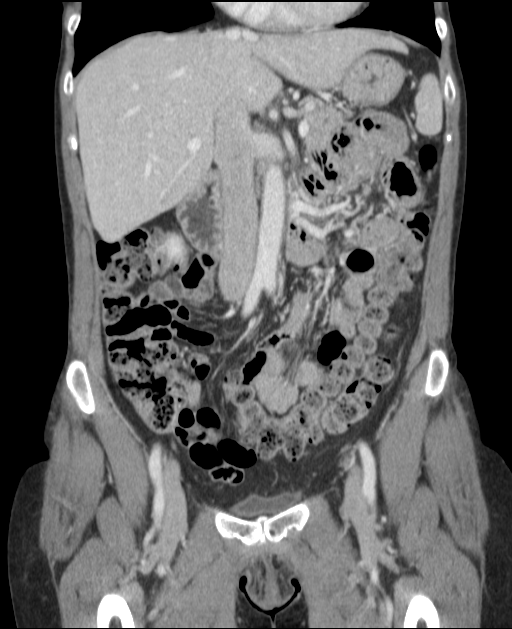
[im 48/107  soft-tissue]
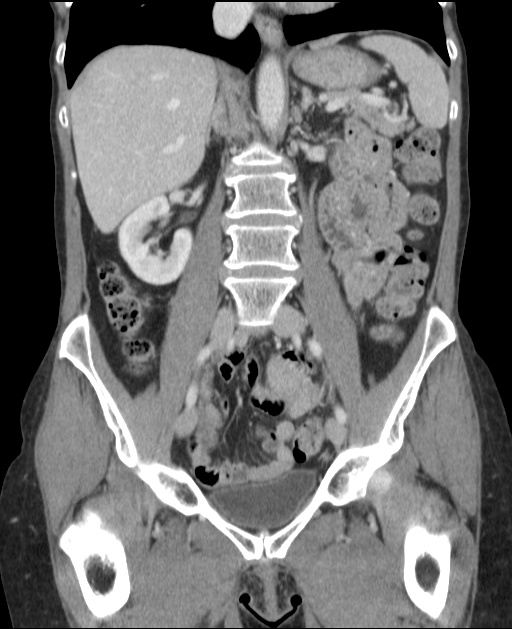
[im 59/107  soft-tissue]
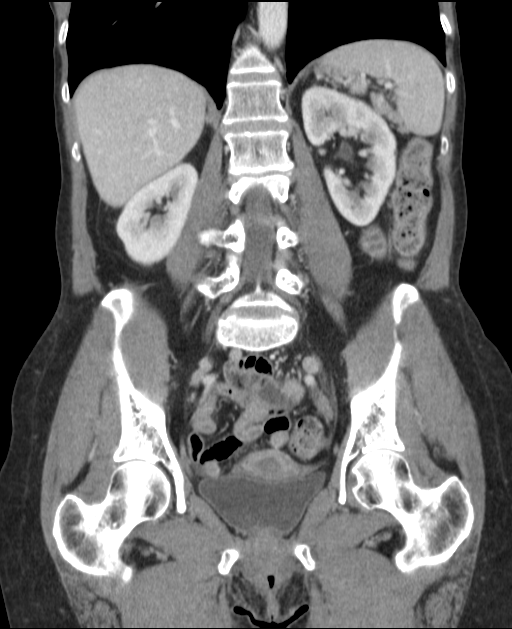

[16 of 46 positions shown; findings below may reference images not displayed]

FINDINGS: LUNG BASES: Lung bases are clear. No pleural effusions. 
HEPATOBILIARY: No mass or biliary dilatation. No gallstones. 
SPLEEN: Normal in size. 
PANCREAS: No ductal dilatation or mass.   
ADRENALS: No mass. 
GENITOURINARY: No enhancing mass or hydronephrosis.  The uterus has low density 
centrally measuring up to 9 mm which is thickened for a postmenopausal female. 
No right or left adnexal mass. Bladder is unremarkable. 
LYMPH NODES: No adenopathy. 
STOMACH, SMALL BOWEL AND COLON: No bowel wall thickening or obstruction. Stomach 
is decompressed and difficult to assess. 
VASCULAR STRUCTURES: No aneurysm.  
MUSCULOSKELETAL: No acute osseous abnormality. Scattered degenerative changes.  
ADDITIONAL FINDINGS: No free fluid in the pelvis and no pelvic mass or 
adenopathy. No inguinal adenopathy. Trace free fluid in pelvis. The appendix 
appears to been removed without inflammatory changes or mass in the right lower 
quadrant.
IMPRESSION: Low density centrally within the uterus measuring 9 mm, which is thickened for a 
postmenopausal female, would recommend follow-up with transvaginal pelvic 
ultrasound. 
Trace free fluid in the pelvis. 
No inflammatory changes or obstruction or mass seen elsewhere in the abdomen or 
pelvis. 
Appendectomy with no mass or abnormality in the right lower quadrant. RADIATION 
DOSE REDUCTION: All CT scans are performed using radiation dose reduction 
techniques, when applicable.  Technical factors are evaluated and adjusted to 
ensure appropriate moderation of exposure.  Automated dose management technology 
is applied to adjust the radiation doses to minimize exposure while achieving 
diagnostic quality images.

## 2021-04-08 IMAGING — MR MRI LUMBAR SPINE WITHOUT CONTRAST
4 of 6 series · 18 of 48 positions shown · IV contrast (gadolinium)
Comparison: None

________________________________________________________________________________________________ 
MRI LUMBAR SPINE WITHOUT CONTRAST, 04/08/2021 [DATE]: 
CLINICAL INDICATION: 65-year-old female with low back pain. Previous 
radiofrequency ablation in the lumbar spine. Now with left buttock and left leg 
weakness.
TECHNIQUE: Multiplanar, multiecho position MR images of the lumbar spine were 
performed without intravenous gadolinium enhancement. Patient was scanned on a 
1.5T magnet.

[Series 101: survey · axial · 10.0mm · 1.39mm/px · z∈[-33,+201]mm · 5 of 10 slices shown]
[im 1/10]
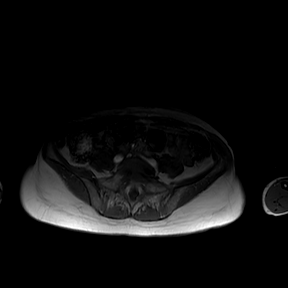
[im 3/10]
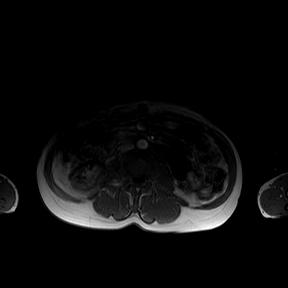
[im 5/10]
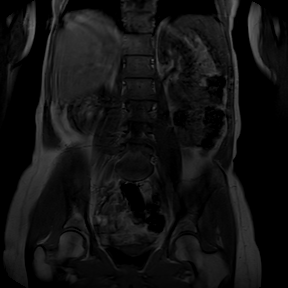
[im 7/10]
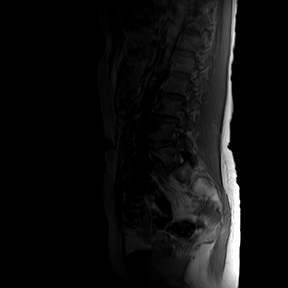
[im 10/10]
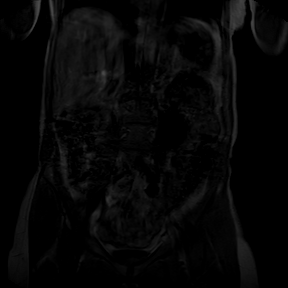

[Series 201: t2w_cor-surv · coronal · 6.0mm · 0.60mm/px · 4 of 8 slices shown]
[im 1/8]
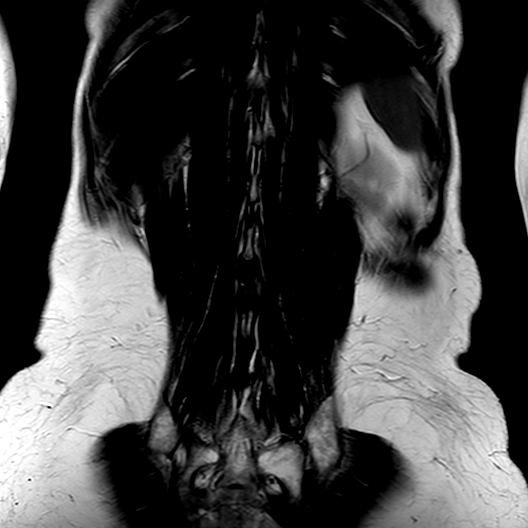
[im 3/8]
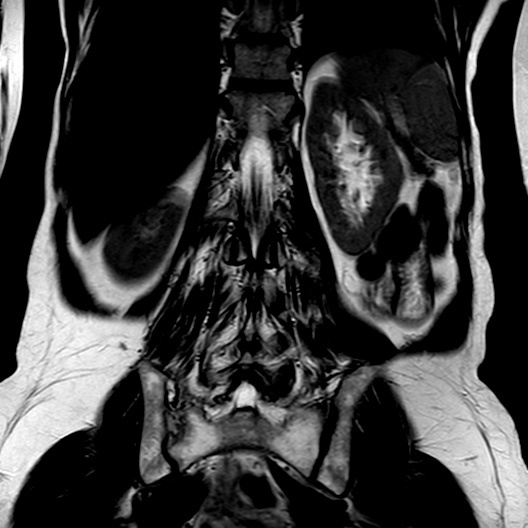
[im 5/8]
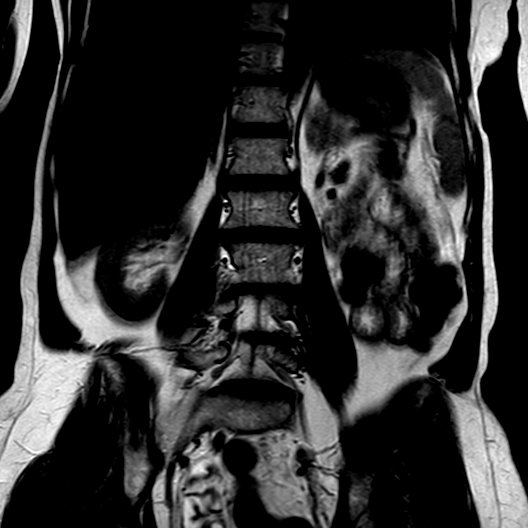
[im 8/8]
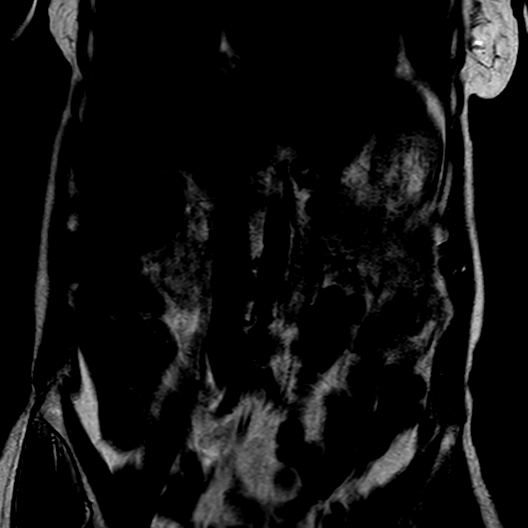

[Series 301: t1_tse_sag · sagittal · 4.0mm · 0.48mm/px · 6 of 17 slices shown]
[im 1/17]
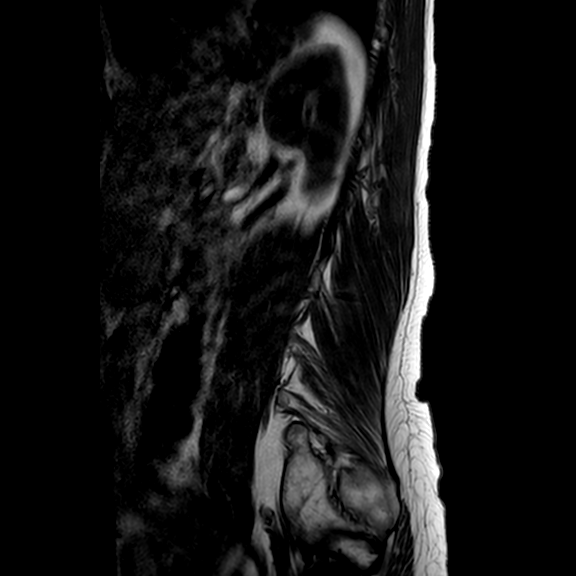
[im 3/17]
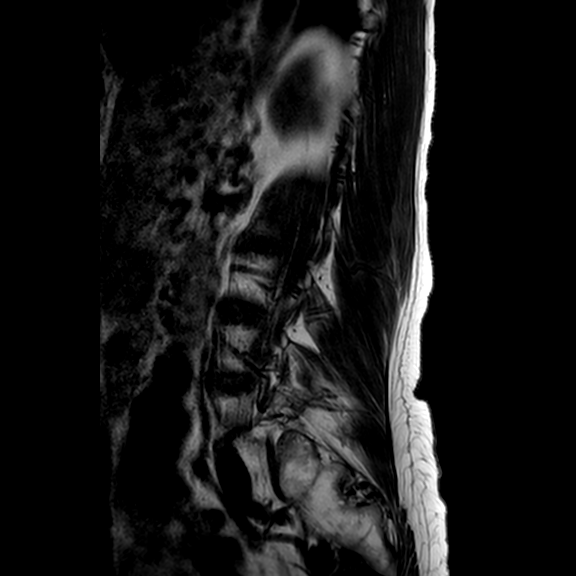
[im 5/17]
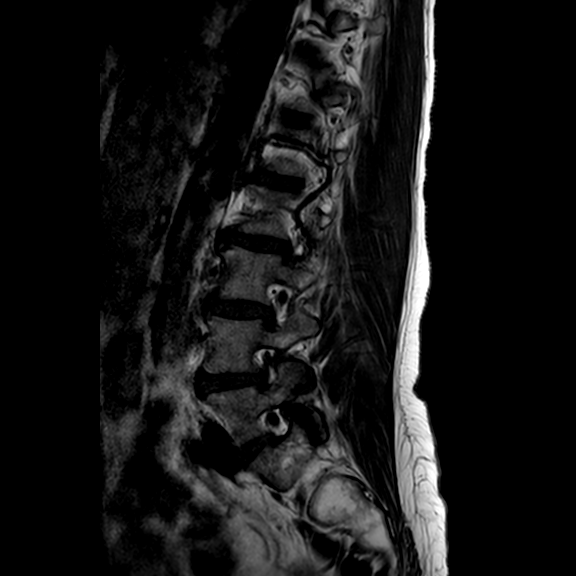
[im 7/17]
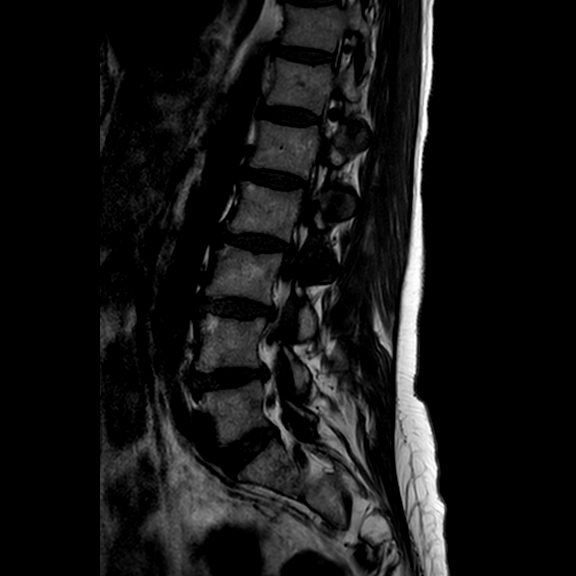
[im 10/17]
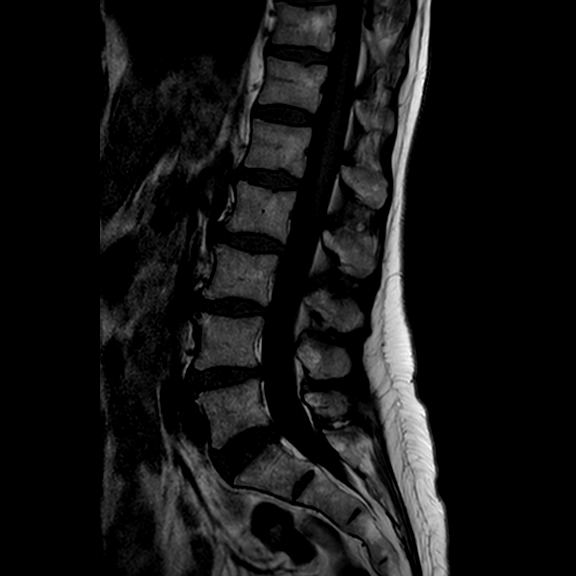
[im 14/17]
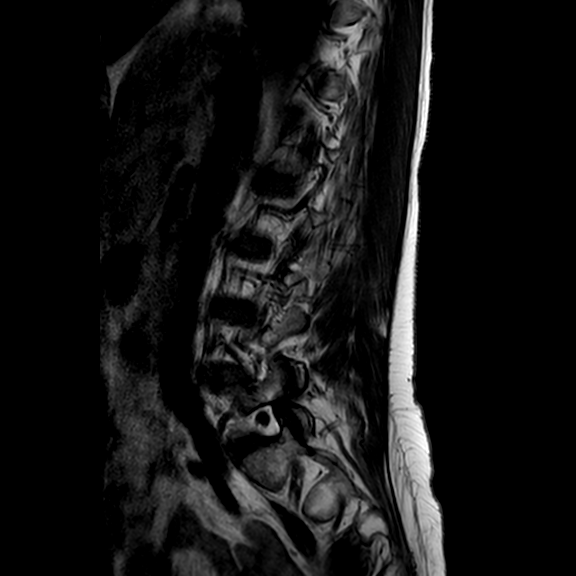

[Series 401: t2_tse_sag · sagittal · 4.0mm · 0.43mm/px · 3 of 17 slices shown]
[im 3/17]
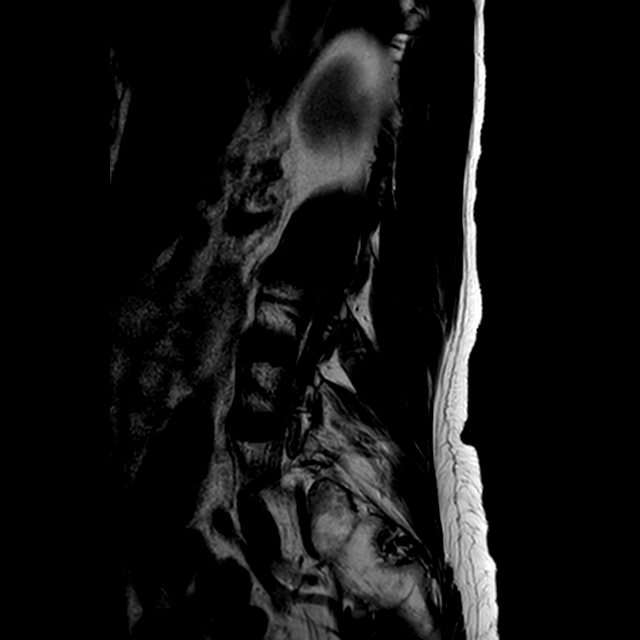
[im 10/17]
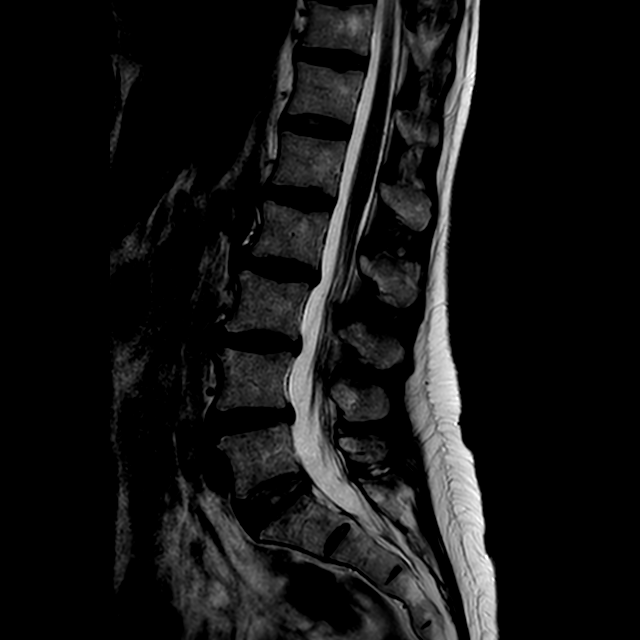
[im 14/17]
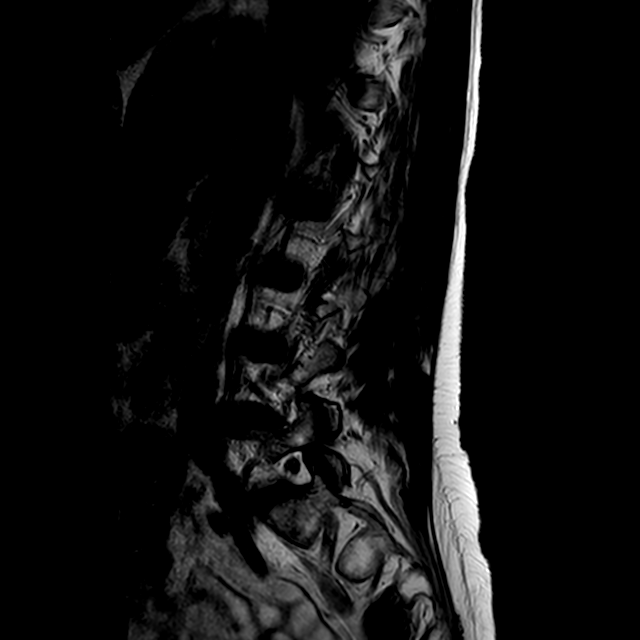

[18 of 48 positions shown; findings below may reference images not displayed]

FINDINGS: 5 lumbar type vertebral bodies. 3 mm retrolisthesis L2 on L3. 
Otherwise there is anatomic sagittal alignment. Minimal levoconvex lumbar 
scoliosis measures 7.5 degrees, as measured from L3 through L5, apex to the left 
at L3-L4. Vertebral body height preserved. No fracture. Hemangioma S3. Normal 
marrow signal for age. No marrow or ligamentous edema. There is mild to moderate 
soft tissue edema adjacent to the L4-L5 and L5-S1 facets bilaterally, but most 
conspicuous adjacent to the right L4-L5 facet. Findings could reflect 
posttreatment changes or potentially active facet arthritis. Correlate 
clinically. The conus ends at the inferior L2 level. Cauda equina unremarkable. 
No extraspinal abnormality. 
Modic I-II: None. 
Ligamentum Flavum > 2.5 mm: All levels 
T11-T12: Loss of disc signal. Schmorls nodes. Central canal and foramina are 
patent. Normal facets. 
T12-L1: Loss of disc signal. Normal disc height. Central canal and foramina are 
patent. Incidental left-sided nerve root sleeve cyst. Normal facets. 
L1-L2: Mild loss of disc height with slight loss of disc signal. Schmorls node 
configuration. Central canal and foramina are patent. Normal facets. 
L2-L3: Mild loss of disc height with slight loss of disc signal. Anterior 
marginal osteophytes. Trace retrolisthesis with mild annular bulge. Minimal 
canal stenosis. Foramina patent. Normal facets. 
L3-L4: Mild loss of disc height with loss of disc signal. Mild annular bulge 
with minimal canal stenosis. Mild bilateral facet arthropathy with small 
bilateral facet synovial effusions. Foramina patent. Small bilateral foraminal 
protrusions are present. 
L4-L5: Mild to moderate loss of disc height. Loss of disc signal. Minimal 
annular bulge is slightly eccentric to the left with evidence of a left 
foraminal and paracentral disc protrusion with associated annular fissure and 
mild/moderate left foraminal narrowing. Right neural foramen is minimally 
narrowed. Mild facet arthropathy bilaterally. 
L5-S1: Mild loss of disc signal with tiny central disc protrusion. Central canal 
and foramina are patent. Mild bilateral facet arthropathy.
IMPRESSION: Multilevel lumbar degenerative changes. 
Mild-to-moderate soft tissue edema adjacent to the lower lumbar facets, detailed 
above. Findings could reflect posttreatment changes or potentially active facet 
arthritis. Correlate clinically. 
L4-L5, left foraminal and paracentral disc protrusion with associated annular 
fissure, which can be a pain generator, and mild-to-moderate left foraminal 
narrowing.

## 2022-05-12 IMAGING — MG MAMMOGRAPHY DIAGNOSTIC BILATERAL 3[PERSON_NAME]
8 series · 9 of 24 positions shown · non-contrast
Comparison: Comparison was made to prior examinations.

________________________________________________________________________________________________ 
MAMMOGRAPHY DIAGNOSTIC BILATERAL 3SORIN OXENDINE, 05/12/2022 [DATE]: 
CLINICAL INDICATION: History of multiple biopsies. History of right breast pain 
intermittent since June 2020. Question developing right nipple inversion.
TECHNIQUE: Digital bilateral mammograms and 3-D Tomosynthesis were obtained. 
These were interpreted both primarily and with the aid of computer-aided 
detection system.  
BREAST DENSITY: (Level C) The breasts are heterogeneously dense, which may 
obscure small masses.

[R CC]
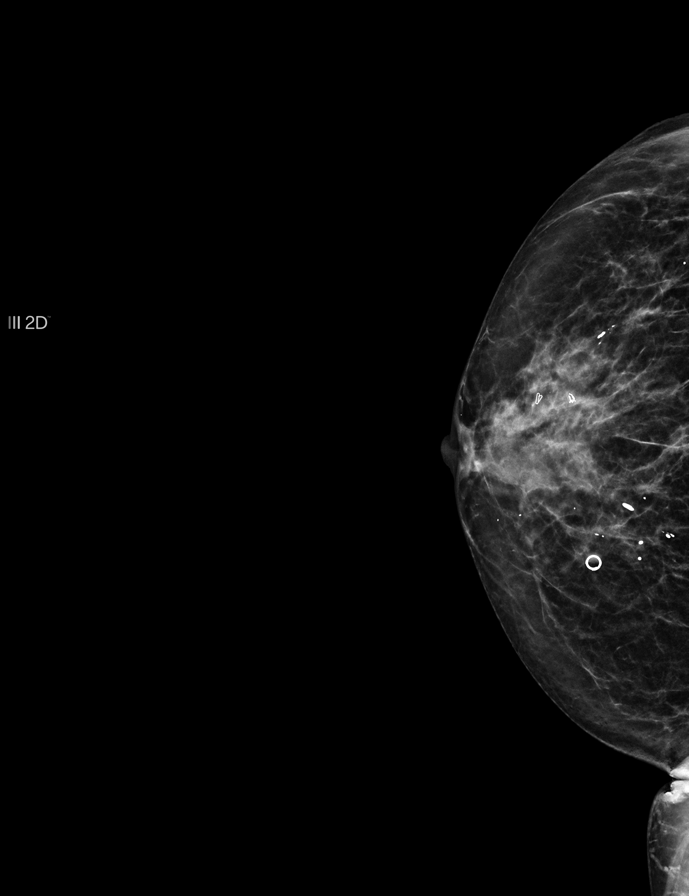

[L MLO]
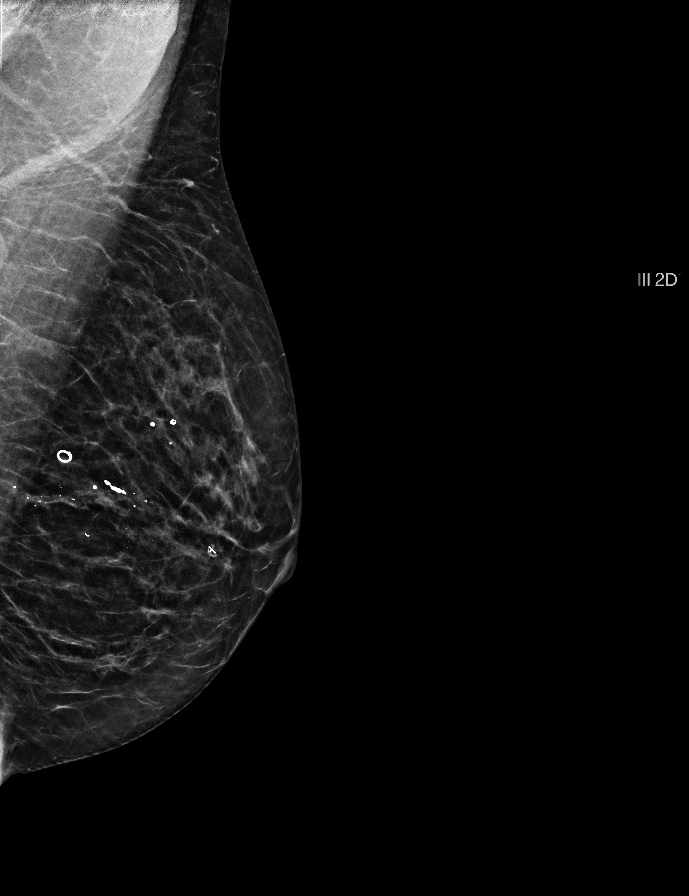

[L CC]
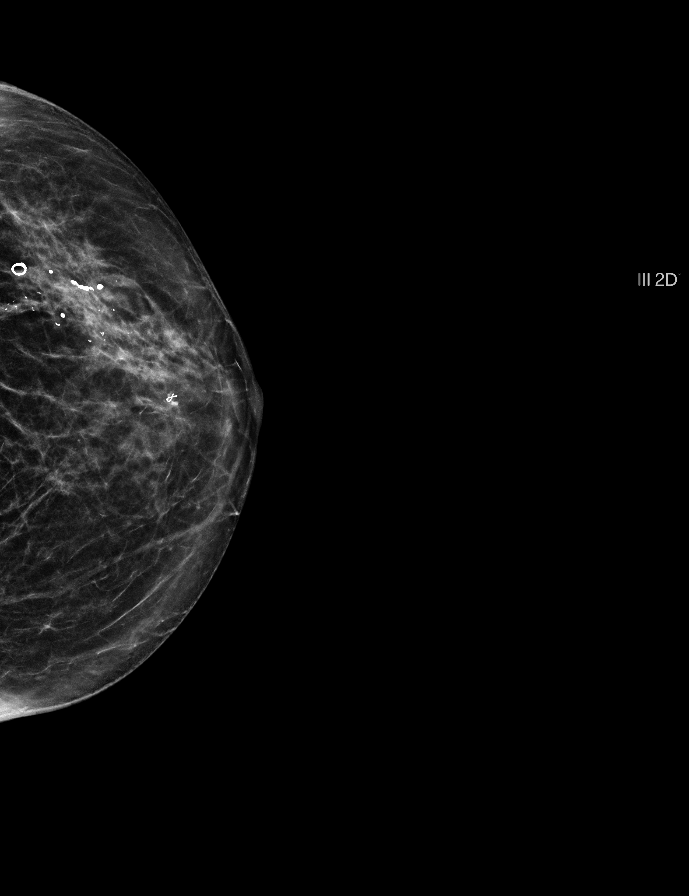

[R MLO]
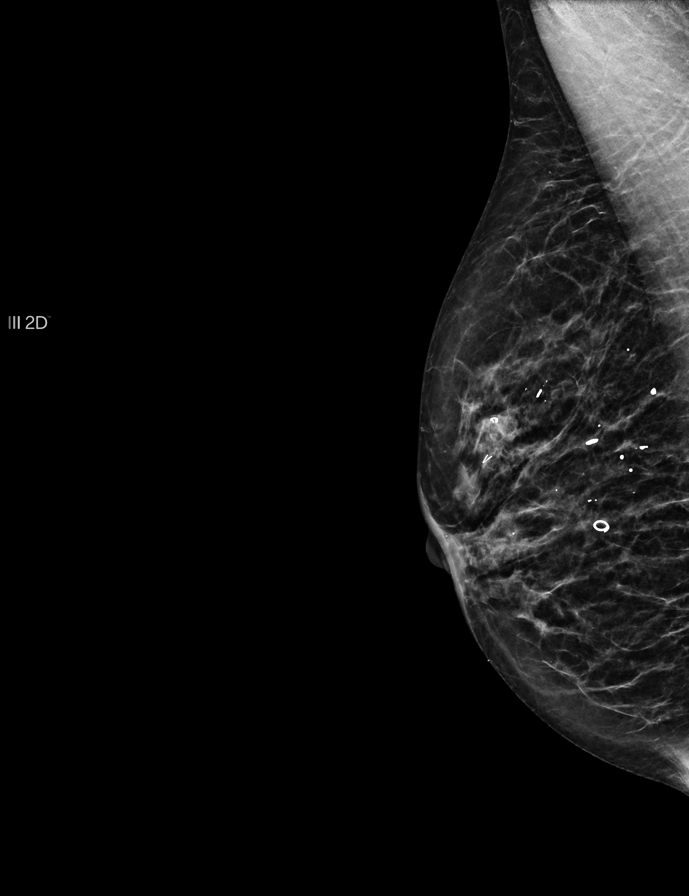

[R CC tomo · 2 of 39 frames shown]
[frame 13/39]
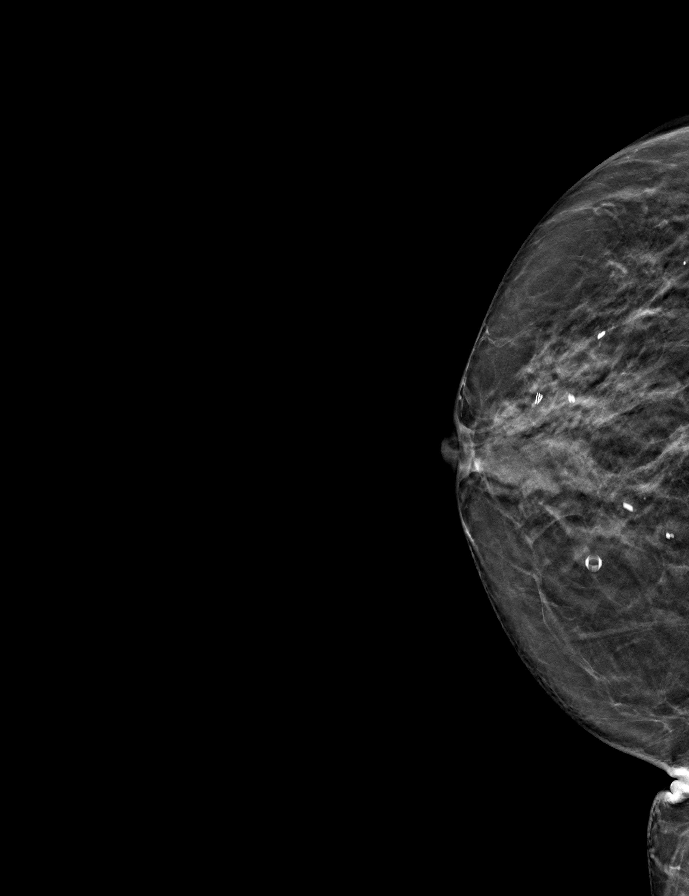
[frame 20/39]
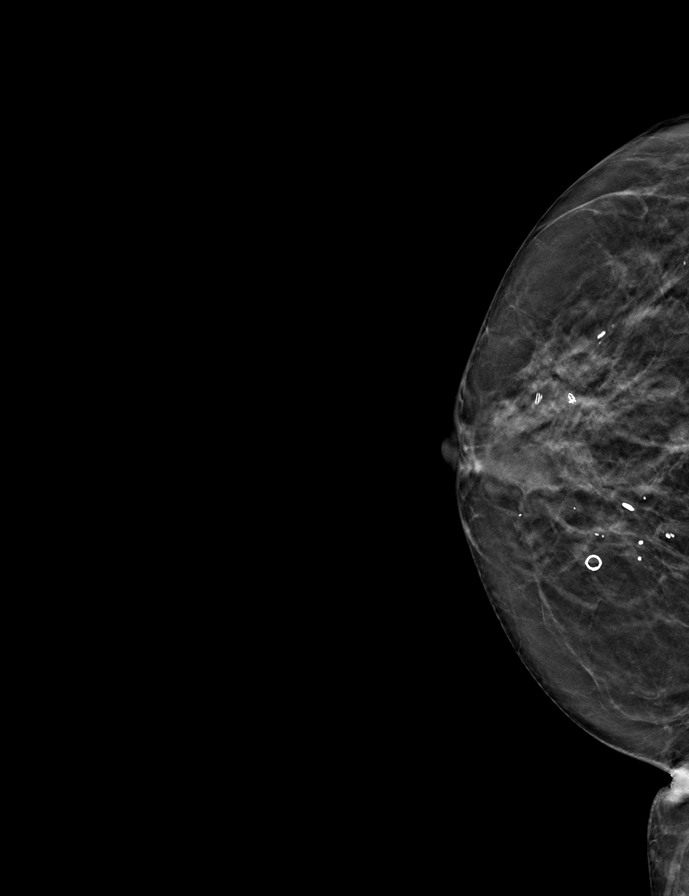

[L CC tomo · tomo slice 21/42.0]
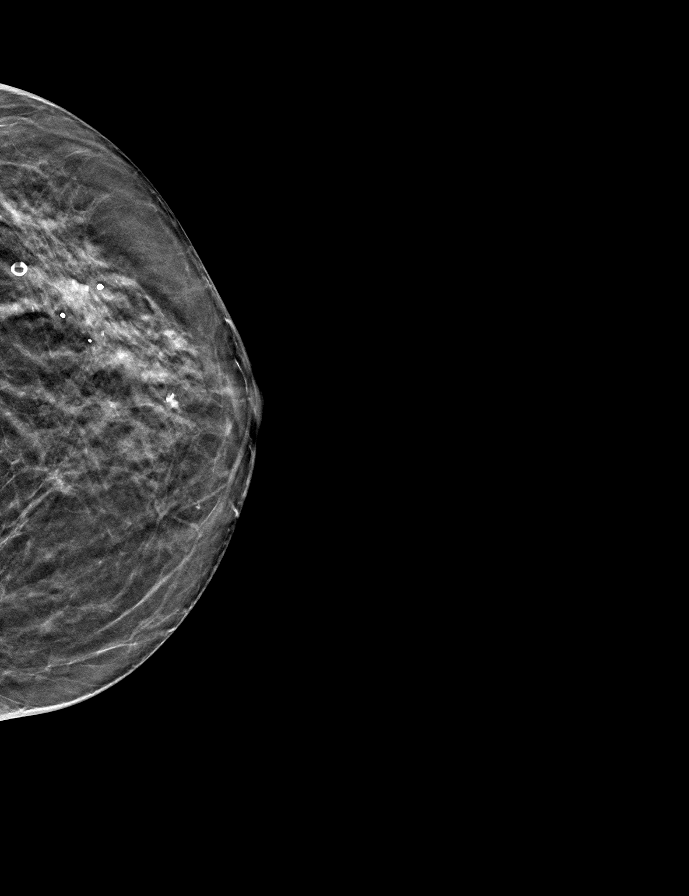

[R MLO tomo · tomo slice 19/37.0]
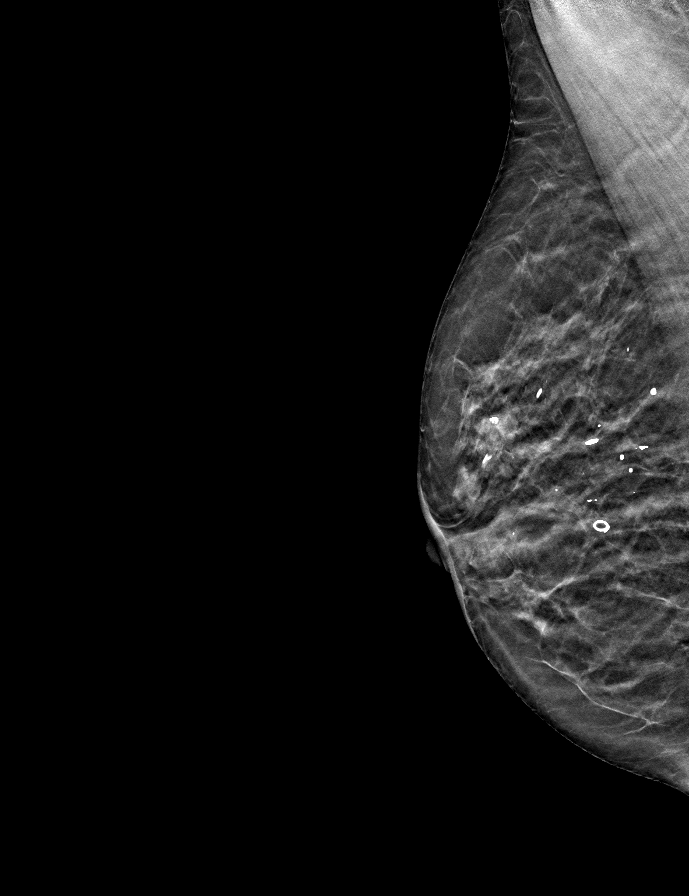

[L MLO tomo · tomo slice 21/41.0]
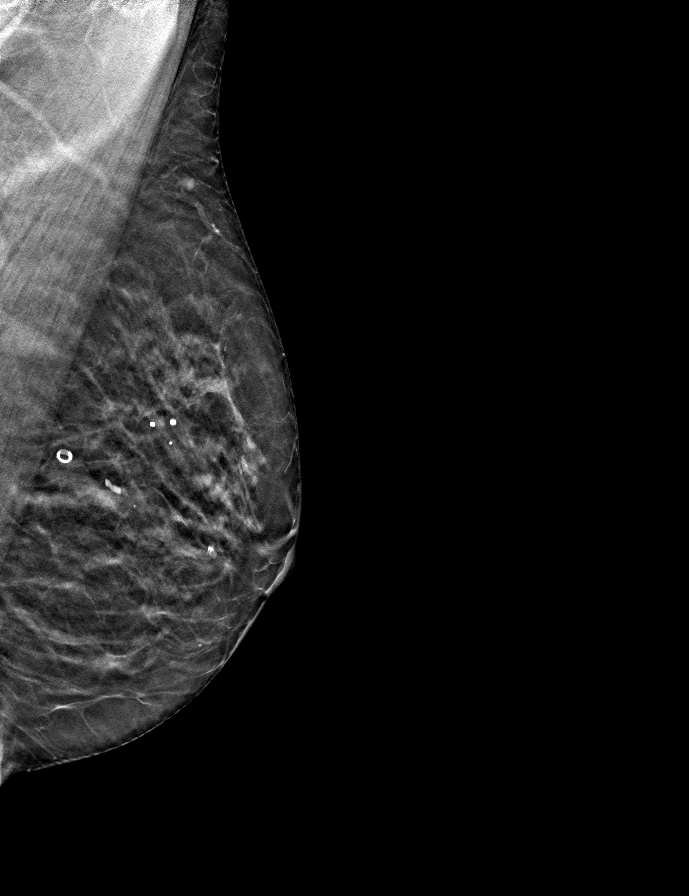

[9 of 24 positions shown; findings below may reference images not displayed]

FINDINGS: Biopsy clips are seen bilaterally. Benign calcifications are seen 
bilaterally. No new suspicious mass seen.
IMPRESSION: (BI-RADS 2) Benign findings. Routine mammographic follow-up is recommended.

## 2022-12-10 IMAGING — MG MAMMOGRAPHY DIAGNOSTIC BILATERAL 3[PERSON_NAME]
8 series · 9 of 24 positions shown · non-contrast
Comparison: Comparison was made to prior examinations.

________________________________________________________________________________________________ 
MAMMOGRAPHY DIAGNOSTIC BILATERAL 3MINOO GONSALVES, RIGHT BREAST ULTRASOUND UNILATERAL 
COMPLETE, 12/10/2022 [DATE]: 
CLINICAL INDICATION: Mastodynia
TECHNIQUE: Digital bilateral mammograms and 3-D Tomosynthesis were obtained. 
These were interpreted both primarily and with the aid of computer-aided 
detection system. Right breast sonography was performed of all 4 quadrants and 
axilla. 
BREAST DENSITY: (Level B) There are scattered areas of fibroglandular density.

[R CC]
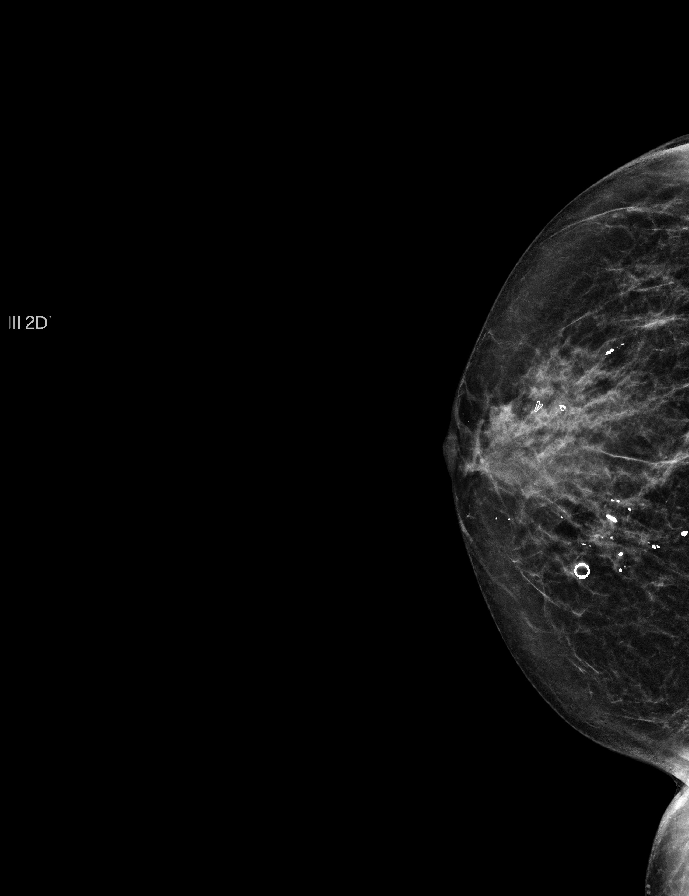

[R MLO]
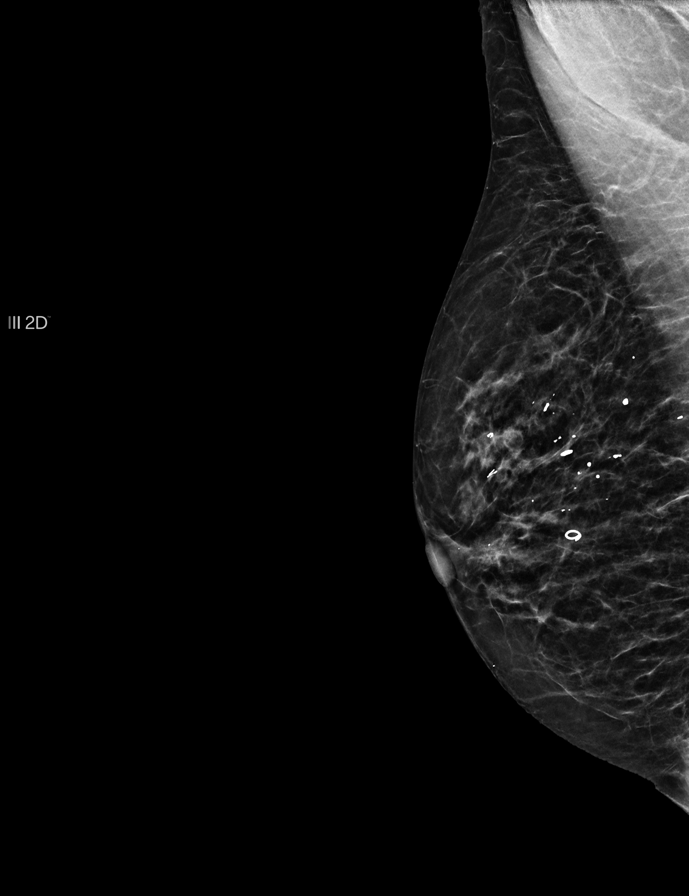

[L CC]
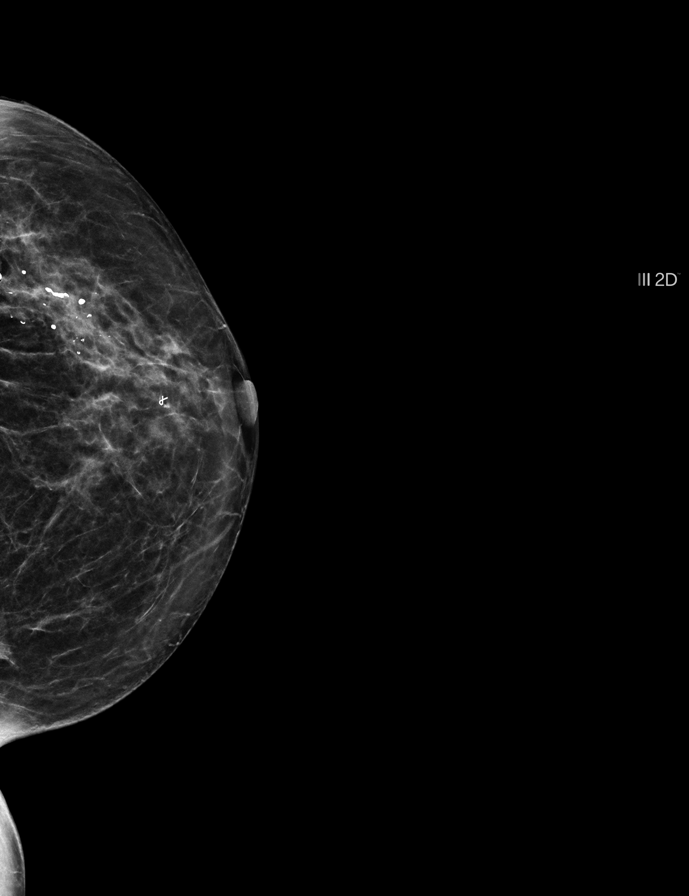

[L MLO]
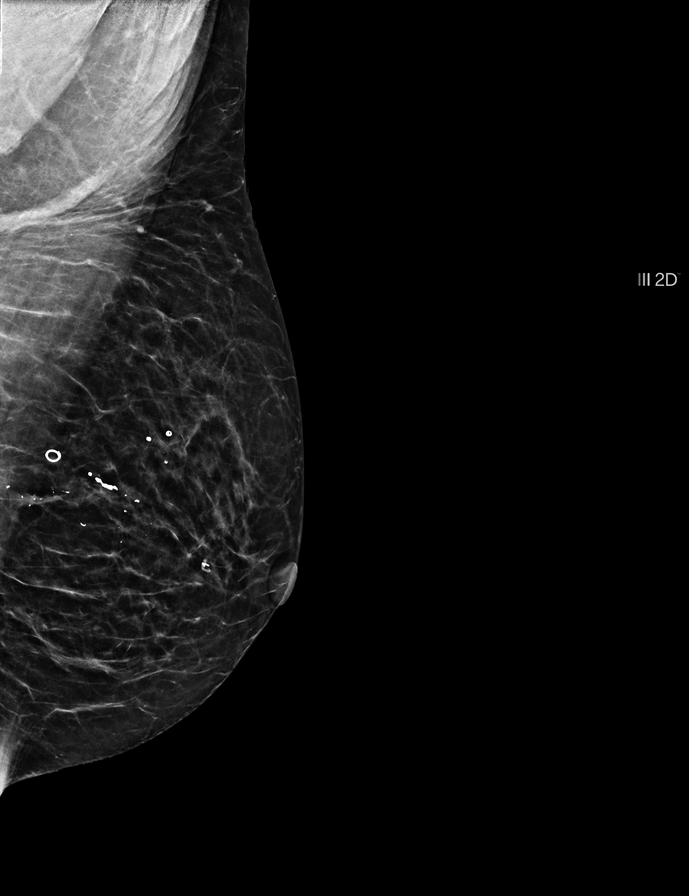

[L MLO tomo · 2 of 46 frames shown]
[frame 15/46]
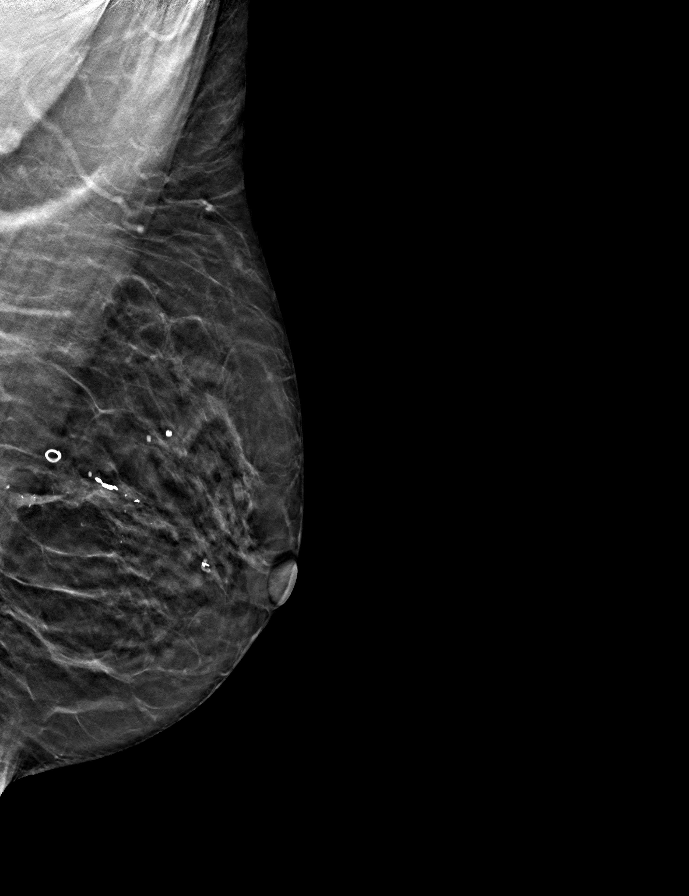
[frame 23/46]
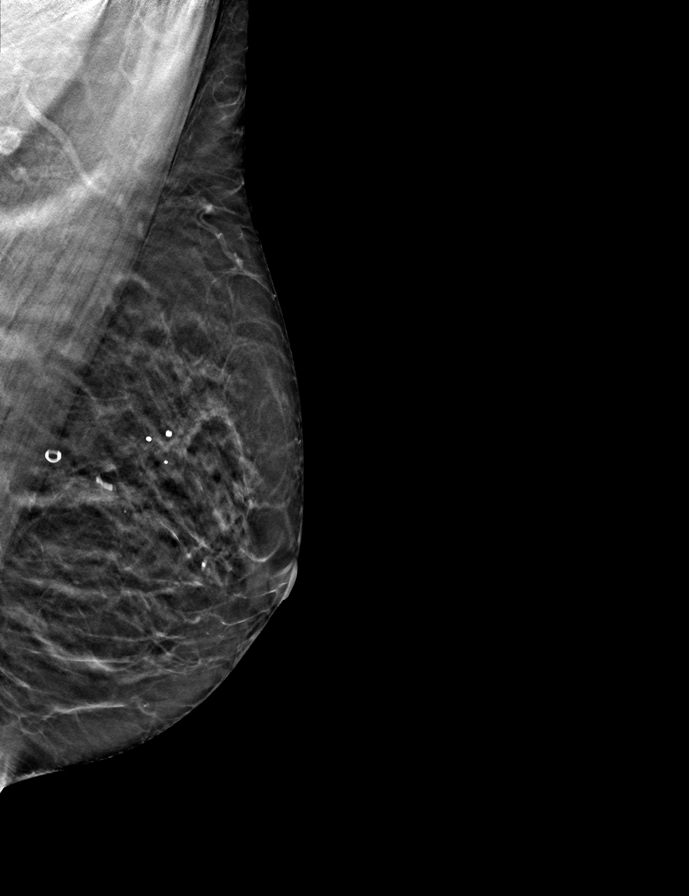

[R MLO tomo · tomo slice 21/41.0]
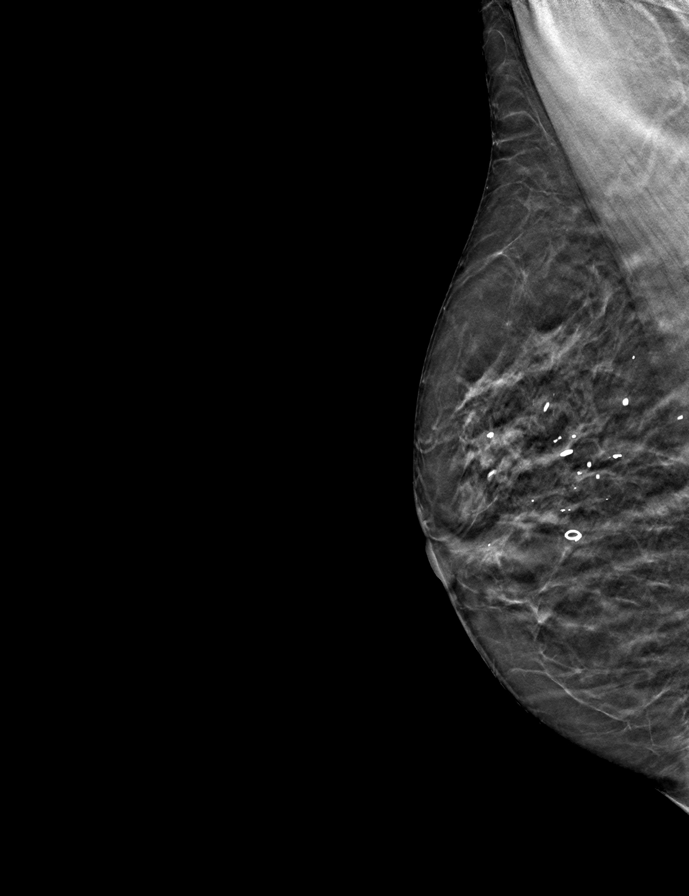

[L CC tomo · tomo slice 22/43.0]
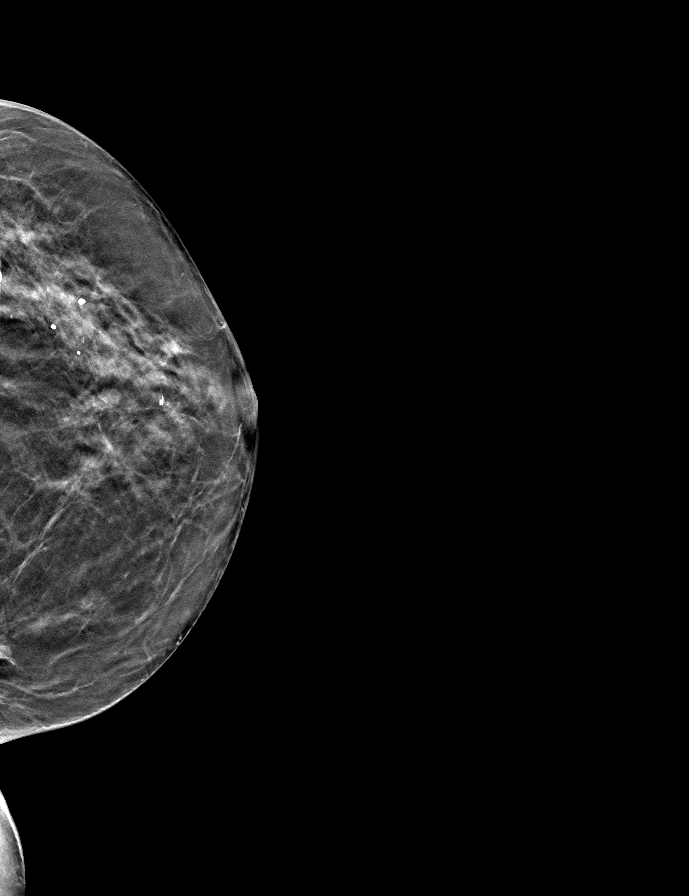

[R CC tomo · tomo slice 21/41.0]
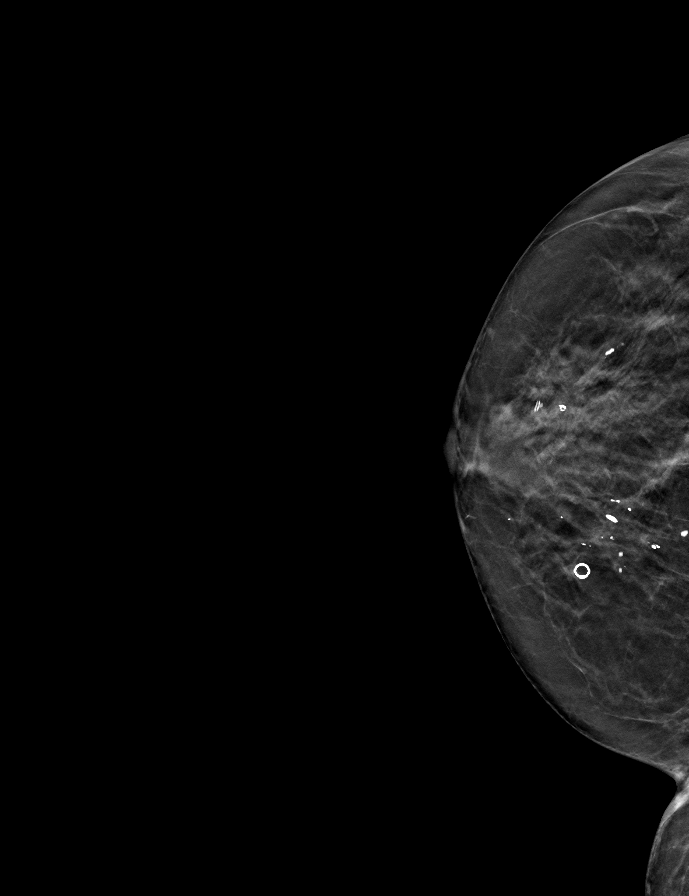

[9 of 24 positions shown; findings below may reference images not displayed]

FINDINGS: Scattered glandular densities are again identified. Stereotactic clips 
identified. The appearance of the breast are unchanged compared to multiple 
prior exams on mammography. No new abnormality seen. 
Sonography reveals mildly dilated ducts in the retroareolar region. Nonspecific. 
No mass identified.
IMPRESSION: Mild dilated ducts in the retroareolar region. Stable mammographic appearance. 
(BI-RADS 2) Benign findings. Routine mammographic follow-up is recommended.
